# Patient Record
Sex: Female | Born: 1965 | Race: White | Hispanic: No | Marital: Single | Smoking: Former smoker
Health system: Southern US, Community
[De-identification: ages and names within clinical notes are randomized; demographics above are authoritative.]

## PROBLEM LIST (undated history)

## (undated) DIAGNOSIS — I10 Essential (primary) hypertension: Secondary | ICD-10-CM

## (undated) DIAGNOSIS — E7800 Pure hypercholesterolemia, unspecified: Secondary | ICD-10-CM

## (undated) HISTORY — DX: Pure hypercholesterolemia, unspecified: E78.00

---

## 1998-02-28 ENCOUNTER — Ambulatory Visit (HOSPITAL_COMMUNITY): Admission: RE | Admit: 1998-02-28 | Discharge: 1998-02-28 | Payer: Self-pay | Admitting: *Deleted

## 1998-07-16 ENCOUNTER — Inpatient Hospital Stay (HOSPITAL_COMMUNITY): Admission: AD | Admit: 1998-07-16 | Discharge: 1998-07-18 | Payer: Self-pay | Admitting: *Deleted

## 2001-09-22 ENCOUNTER — Ambulatory Visit (HOSPITAL_COMMUNITY): Admission: RE | Admit: 2001-09-22 | Discharge: 2001-09-22 | Payer: Self-pay | Admitting: *Deleted

## 2001-10-25 ENCOUNTER — Inpatient Hospital Stay (HOSPITAL_COMMUNITY): Admission: AD | Admit: 2001-10-25 | Discharge: 2001-10-25 | Payer: Self-pay | Admitting: *Deleted

## 2001-11-03 ENCOUNTER — Encounter: Admission: RE | Admit: 2001-11-03 | Discharge: 2001-11-03 | Payer: Self-pay | Admitting: *Deleted

## 2001-11-17 ENCOUNTER — Encounter: Admission: RE | Admit: 2001-11-17 | Discharge: 2001-11-17 | Payer: Self-pay | Admitting: *Deleted

## 2001-11-24 ENCOUNTER — Ambulatory Visit (HOSPITAL_COMMUNITY): Admission: RE | Admit: 2001-11-24 | Discharge: 2001-11-24 | Payer: Self-pay | Admitting: *Deleted

## 2001-12-01 ENCOUNTER — Encounter: Admission: RE | Admit: 2001-12-01 | Discharge: 2001-12-01 | Payer: Self-pay | Admitting: *Deleted

## 2001-12-06 ENCOUNTER — Encounter: Admission: RE | Admit: 2001-12-06 | Discharge: 2001-12-06 | Payer: Self-pay | Admitting: *Deleted

## 2001-12-08 ENCOUNTER — Encounter: Admission: RE | Admit: 2001-12-08 | Discharge: 2001-12-08 | Payer: Self-pay | Admitting: *Deleted

## 2001-12-09 ENCOUNTER — Encounter: Admission: RE | Admit: 2001-12-09 | Discharge: 2001-12-14 | Payer: Self-pay | Admitting: *Deleted

## 2001-12-14 ENCOUNTER — Encounter: Admission: RE | Admit: 2001-12-14 | Discharge: 2001-12-14 | Payer: Self-pay | Admitting: *Deleted

## 2001-12-21 ENCOUNTER — Encounter: Admission: RE | Admit: 2001-12-21 | Discharge: 2001-12-21 | Payer: Self-pay | Admitting: *Deleted

## 2001-12-26 ENCOUNTER — Ambulatory Visit (HOSPITAL_COMMUNITY): Admission: RE | Admit: 2001-12-26 | Discharge: 2001-12-26 | Payer: Self-pay | Admitting: *Deleted

## 2001-12-29 ENCOUNTER — Encounter: Admission: RE | Admit: 2001-12-29 | Discharge: 2001-12-29 | Payer: Self-pay | Admitting: Obstetrics and Gynecology

## 2002-01-04 ENCOUNTER — Encounter: Admission: RE | Admit: 2002-01-04 | Discharge: 2002-01-04 | Payer: Self-pay | Admitting: *Deleted

## 2002-01-10 ENCOUNTER — Inpatient Hospital Stay (HOSPITAL_COMMUNITY): Admission: AD | Admit: 2002-01-10 | Discharge: 2002-01-12 | Payer: Self-pay | Admitting: *Deleted

## 2002-01-10 ENCOUNTER — Encounter (INDEPENDENT_AMBULATORY_CARE_PROVIDER_SITE_OTHER): Payer: Self-pay | Admitting: Specialist

## 2002-08-22 ENCOUNTER — Encounter: Admission: RE | Admit: 2002-08-22 | Discharge: 2002-08-22 | Payer: Self-pay | Admitting: Obstetrics and Gynecology

## 2009-04-19 ENCOUNTER — Emergency Department (HOSPITAL_COMMUNITY): Admission: EM | Admit: 2009-04-19 | Discharge: 2009-04-19 | Payer: Self-pay | Admitting: Emergency Medicine

## 2014-11-26 ENCOUNTER — Ambulatory Visit (INDEPENDENT_AMBULATORY_CARE_PROVIDER_SITE_OTHER): Payer: Medicaid Other | Admitting: Obstetrics & Gynecology

## 2014-11-26 ENCOUNTER — Encounter: Payer: Self-pay | Admitting: Obstetrics & Gynecology

## 2014-11-26 ENCOUNTER — Other Ambulatory Visit (HOSPITAL_COMMUNITY)
Admission: RE | Admit: 2014-11-26 | Discharge: 2014-11-26 | Disposition: A | Discharge status: Home / Self Care | Payer: Medicaid Other | Source: Ambulatory Visit | Attending: Obstetrics & Gynecology | Admitting: Obstetrics & Gynecology

## 2014-11-26 VITALS — BP 106/69 | HR 55 | Ht 63.0 in | Wt 121.2 lb

## 2014-11-26 DIAGNOSIS — Z1151 Encounter for screening for human papillomavirus (HPV): Secondary | ICD-10-CM | POA: Insufficient documentation

## 2014-11-26 DIAGNOSIS — Z01419 Encounter for gynecological examination (general) (routine) without abnormal findings: Secondary | ICD-10-CM | POA: Insufficient documentation

## 2014-11-26 DIAGNOSIS — Z124 Encounter for screening for malignant neoplasm of cervix: Secondary | ICD-10-CM | POA: Diagnosis not present

## 2014-11-26 DIAGNOSIS — Z3009 Encounter for other general counseling and advice on contraception: Secondary | ICD-10-CM | POA: Diagnosis not present

## 2014-11-26 NOTE — Progress Notes (Signed)
CLINIC ENCOUNTER NOTE  History:  50 y.o. [phone] here today for evaluation of episodic LLQ pain and routine annual gynecologic exam. Episodic for one week, rates 1/10, not associated with intercourse, bowel movement or urination.  Not alleviating or exacerbating factors, not taking any medications.  Unaware of inciting factors.  She denies any abnormal vaginal discharge, bleeding, problems with intercourse or other concerns.  No current pain.  Gynecologic History Patient's last menstrual period was 11/12/2014 (approximate). Contraception: vasectomy Last Pap: 2012. Results were: normal Last mammogram:Never had one, wants to wait until age 70  Obstetric History OB History  Gravida Para Term Preterm AB SAB TAB Ectopic Multiple Living  3 3 2 1      3     # Outcome Date GA Lbr Len/2nd Weight Sex Delivery Anes PTL Lv  3 Preterm           2 Term           1 Term               History reviewed. No pertinent past medical history.  History reviewed. No pertinent past surgical history.  No current outpatient prescriptions on file prior to visit.   No current facility-administered medications on file prior to visit.    No Known Allergies  History   Social History  . Marital Status: Single    Spouse Name: N/A  . Number of Children: N/A  . Years of Education: N/A   Occupational History  . Not on file.   Social History Main Topics  . Smoking status: Never Smoker   . Smokeless tobacco: Never Used  . Alcohol Use: No  . Drug Use: No  . Sexual Activity:    Partners: Male    Birth Control/ Protection: Surgical     Comment: vasectomy   Other Topics Concern  . Not on file   Social History Narrative  . No narrative on file    Family History  Problem Relation Age of Onset  . Diabetes Father   . Heart disease Father   . Diabetes Maternal Grandmother   . Diabetes Maternal Grandfather   . Diabetes Paternal Grandmother   . Diabetes Paternal Grandfather     The following  portions of the patient's history were reviewed and updated as appropriate: allergies, current medications, past family history, past medical history, past social history, past surgical history and problem list.  Review of Systems Pertinent items are noted in HPI.   Objective:   BP 106/69 mmHg  Pulse 55  Ht 5\' 3"  (1.6 m)  Wt 121 lb 3.2 oz (54.976 kg)  BMI 21.48 kg/m2  LMP 11/12/2014 (Approximate) CONSTITUTIONAL: Well-developed, well-nourished female in no acute distress.  HENT:  Normocephalic, atraumatic, External right and left ear normal. Oropharynx is clear and moist EYES: Conjunctivae and EOM are normal. Pupils are equal, round, and reactive to light. No scleral icterus.  NECK: Normal range of motion, supple, no masses.  Normal thyroid.  SKIN: Skin is warm and dry. No rash noted. Not diaphoretic. No erythema. No pallor. Fletcher: Alert and oriented to person, place, and time. Normal reflexes, muscle tone coordination. No cranial nerve deficit noted. PSYCHIATRIC: Normal mood and affect. Normal behavior. Normal judgment and thought content. CARDIOVASCULAR: Normal heart rate noted, regular rhythm RESPIRATORY: Clear to auscultation bilaterally. Effort and breath sounds normal, no problems with respiration noted. BREASTS: Symmetric in size. No masses, skin changes, nipple drainage, or lymphadenopathy. ABDOMEN: Soft, normal bowel sounds, no distention noted.  No tenderness, rebound or guarding.  PELVIC: Normal appearing external genitalia; normal appearing vaginal mucosa and cervix.  No abnormal discharge noted.  Pap smear obtained.  Normal uterine size, no other palpable masses, no uterine or adnexal tenderness. MUSCULOSKELETAL: Normal range of motion. No tenderness.  No cyanosis, clubbing, or edema.  2+ distal pulses.   Assessment:   Annual gynecologic examination with pap smear   Plan:  Will follow up results of pap smear and manage accordingly. Mammogram to be scheduled next  year Continue vasectomy for contraception No pelvic pain on exam, she was told to call/go to ER if pain recurs or gets worse. Can take NSAIDs as needed. Routine preventative health maintenance measures emphasized. Please refer to After Visit Summary for other counseling recommendations.   Verita Schneiders, MD, [facility] [place] [facility] [employer], [place]

## 2014-11-26 NOTE — Progress Notes (Signed)
Left side pelvic pain for about a week and a half.

## 2014-11-28 LAB — CYTOLOGY - PAP

## 2016-02-20 ENCOUNTER — Encounter: Payer: Self-pay | Admitting: Student

## 2016-02-24 ENCOUNTER — Ambulatory Visit: Payer: Medicaid Other | Admitting: Student

## 2016-03-09 ENCOUNTER — Encounter: Payer: Self-pay | Admitting: Student

## 2016-03-09 ENCOUNTER — Ambulatory Visit (INDEPENDENT_AMBULATORY_CARE_PROVIDER_SITE_OTHER): Payer: Medicaid Other | Admitting: Student

## 2016-03-09 VITALS — BP 99/74 | HR 67 | Temp 98.0°F | Ht 64.5 in | Wt 130.4 lb

## 2016-03-09 DIAGNOSIS — Z23 Encounter for immunization: Secondary | ICD-10-CM | POA: Diagnosis not present

## 2016-03-09 DIAGNOSIS — M79672 Pain in left foot: Secondary | ICD-10-CM

## 2016-03-09 DIAGNOSIS — Z0000 Encounter for general adult medical examination without abnormal findings: Secondary | ICD-10-CM | POA: Diagnosis present

## 2016-03-09 LAB — LIPID PANEL
Cholesterol: 190 mg/dL (ref 125–200)
HDL: 60 mg/dL (ref >=46)
LDL CALC: 116 mg/dL (ref <130)
Total CHOL/HDL Ratio: 3.2 Ratio (ref <=5.0)
Triglycerides: 68 mg/dL (ref <150)
VLDL: 14 mg/dL (ref <30)

## 2016-03-09 LAB — BASIC METABOLIC PANEL WITH GFR
BUN: 16 mg/dL (ref 7–25)
CO2: 24 mmol/L (ref 20–31)
Calcium: 9 mg/dL (ref 8.6–10.4)
Chloride: 106 mmol/L (ref 98–110)
Creat: 1.15 mg/dL — ABNORMAL HIGH (ref 0.50–1.05)
GFR, Est African American: 64 mL/min (ref >=60)
GFR, Est Non African American: 56 mL/min — ABNORMAL LOW (ref >=60)
Glucose, Bld: 86 mg/dL (ref 65–99)
Potassium: 4.6 mmol/L (ref 3.5–5.3)
Sodium: 139 mmol/L (ref 135–146)

## 2016-03-09 NOTE — Patient Instructions (Signed)
It was great seeing you today!           Colon Cancer Screening  People with early colon cancer usually have no warning signs or symptoms.  If found early, most patients can be cured, but if found when it has already spread, the chance of survival is not as good.  Colon cancer is the second most common cause of concern is in the [place] with over 56,000 deaths from colon cancer in 2005  Colon cancer is a common, treatable disease. Screening tests can find a cancer  early, before you have symptoms, and make it more likely that you will survive the disease.  Who needs to be tested? If you are age 51-75 yrs, you should be tested for colon cancer.  Ways to be tested:  A colonoscopy the best test to detect colon cancer. It requires you to drink a bowel preparation to clean out your colon before the test. During this test, a tube with a camera inserted into your rectum and examines your entire colon. You can be given medicine to make you sleepy during the exam. Therefore, you will not be able to drive immediately after the test. There is a small risk of bowel injury during the test.   Stool cards that you can take home and take a sample of your stool is another option. The cards are not as good as colonoscopy at detecting cancer, but the tests are easier and cheaper.   To schedule the colonoscopy, you can call one of the 3 options below:  [facility]. Phone number: [phone]  [facility]. Phone number: [phone]  [facility]: Phone number [phone]     If we did any lab work today, and the results require attention, either me or my nurse will get in touch with you. If everything is normal, you will get a letter in mail. If you don't hear from [place] in two weeks, please give [place] a call. Otherwise, I look forward to talking with you again at our next visit. If you have any questions or concerns before then, please call the clinic at [phone].  Please bring all your medications to  every doctors visit   Sign up for My Chart to have easy access to your labs results, and communication with your Primary care physician.    Please check-out at the front desk before leaving the clinic.   Take Care,

## 2016-03-09 NOTE — Progress Notes (Signed)
   Subjective:    Patient ID: Bethany Adkins, female    DOB: [birth year], 50 y.o.   MRN: [id number]   HPI  Patient here to establish care. She has no concern today.   Left Foot pain: for one year. Pain is on and off. 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 Likes to know if she can get a referral to podiatry  Past medical history:  Hyperlipidemia: Reports history of high cholesterol in the past. Not found in the system  Family history:  Heart attack in her father in 61's. Passed away in 70's. Also history of diabetes.   Screening Colon Cancer: cousin with history of colon cancer Breast Cancer: aunt and cousin with history of breast cancer Cervical cancer: Normal Pap smear in 11/2014 Skin Cancer: none  Infection STI: Monogamous relationship  Psych/Social Depression: zero on PHQ2 EtOH abuse: Never Tobacco use: quit 2010. 5-pack-year  Drug use: none Work: As Engineer, maintenance Exercise: running every days, high intensity exercise, yoga Diet: cook at home. Never fast food.  Sexual activity: One female partner Birth control: Partner had vasectomy. LMP 9/24. Started to see changes.   Immunizations  TDAP and flu vaccine today  Review of Systems  Constitutional: Negative.  Negative for appetite change, fever and unexpected weight change.  HENT: Negative for dental problem, hearing loss, sore throat and trouble swallowing.   Eyes: Negative for visual disturbance.  Respiratory: Negative for cough, chest tightness, shortness of breath and wheezing.   Cardiovascular: Negative for chest pain, palpitations and leg swelling.  Gastrointestinal: Negative for abdominal pain, blood in stool and constipation.  Endocrine: Negative for cold intolerance and heat intolerance.  Genitourinary: Negative for dyspareunia, dysuria, genital sores, hematuria and vaginal bleeding.  Musculoskeletal: Negative for arthralgias, joint swelling and myalgias.  Skin: Negative for rash.  Neurological: Negative for weakness, numbness  and headaches.  Hematological: Negative for adenopathy. Does not bruise/bleed easily.  Psychiatric/Behavioral: Negative for dysphoric mood and sleep disturbance. The patient is not nervous/anxious.    Objective:   Physical Exam Vitals:   03/09/16 0950  BP: 99/74  Pulse: 67  Temp: 98 F (36.7 C)  TempSrc: Oral  Weight: 130 lb 6.4 oz (59.1 kg)  Height: 5' 4.5" (1.638 m)    General: Alert and oriented. Pleasant and cooperative. Well-nourished and well-developed.  Head: Normocephalic and atraumatic. Eyes: Without icterus, sclera clear and conjunctiva pink.  Ears: Normal auditory acuity. TMs and ear canals normal Cardiovascular: S1, S2 present without murmurs. Extremities without clubbing or edema. Respiratory: Clear to auscultation bilaterally. No wheezes, rales, or rhonchi. No distress.  Gastrointestinal: +BS, soft, non-tender and non-distended. No HSM noted. No guarding or rebound. No masses appreciated.  Genitourinary: Deferred  Musculoskalatal: Symmetrical without gross deformities. Skin: Intact without significant lesions or rashes. Neurologic: Alert and oriented x4; grossly normal. Psych: Alert and cooperative. Normal mood and affect. Heme/Lymph/Immune: No excessive bruising noted    Assessment & Plan:  Visit for preventive health examination Up-to-date on Pap smear. Gave information to schedule colonoscopy and mammogram Will check lipid panel given history of hyperlipidemia. She is not on any medication We will check BMP Flu and TDAP vaccine today   Left foot pain This is a chronic issue. Intermittent. 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 Otherwise her exam is unremarkable. Placed a referral to podiatry

## 2016-03-09 NOTE — Assessment & Plan Note (Addendum)
Up-to-date on Pap smear. Gave information to schedule colonoscopy and mammogram Will check lipid panel given history of hyperlipidemia. She is not on any medication We will check BMP Flu and TDAP vaccine today

## 2016-03-09 NOTE — Assessment & Plan Note (Signed)
This is a chronic issue. Intermittent. 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 Otherwise her exam is unremarkable. Placed a referral to podiatry

## 2016-03-10 ENCOUNTER — Other Ambulatory Visit: Payer: Self-pay | Admitting: Student

## 2016-03-10 ENCOUNTER — Encounter: Payer: Self-pay | Admitting: Student

## 2016-03-10 DIAGNOSIS — R7989 Other specified abnormal findings of blood chemistry: Secondary | ICD-10-CM

## 2016-03-10 NOTE — Progress Notes (Signed)
Creatinine mildly elevated to 1.15. No baseline to compare to. Patient is very active. She says she have run about 5 miles that morning. Recommended repeat BMP in 2-4 weeks. Patient to call the front desk office and schedule an appointments for lab visit.

## 2016-03-10 NOTE — Progress Notes (Signed)
Ordered repeat BMP for mildly elevated creatinine.

## 2016-03-11 ENCOUNTER — Telehealth: Payer: Self-pay | Admitting: Student

## 2016-03-11 NOTE — Telephone Encounter (Signed)
Pt saw Dr. Cyndia Skeeters recently who suggested a colonoscopy. Pt's insurance requires a referral, [facility] please. Pt also needs a referral for a mammogram at [facility]. Please advise. Thanks! ep

## 2016-03-12 ENCOUNTER — Other Ambulatory Visit: Payer: Self-pay | Admitting: Student

## 2016-03-12 DIAGNOSIS — Z0000 Encounter for general adult medical examination without abnormal findings: Secondary | ICD-10-CM

## 2016-03-12 NOTE — Progress Notes (Signed)
Placed GI referral for colonoscopy to [facility] and screening mammogram at [facility].

## 2016-03-12 NOTE — Telephone Encounter (Signed)
Ordered a screening colonoscopy and mammogram as requested. Thanks!

## 2016-03-13 NOTE — Telephone Encounter (Signed)
LVM to inform pt that referrals have been made. Ottis Stain, CMA

## 2016-03-16 NOTE — Telephone Encounter (Signed)
Pt informed. Zimmerman Rumple, April D, CMA  

## 2016-03-19 ENCOUNTER — Ambulatory Visit (INDEPENDENT_AMBULATORY_CARE_PROVIDER_SITE_OTHER): Payer: Medicaid Other

## 2016-03-19 ENCOUNTER — Encounter: Payer: Self-pay | Admitting: Podiatry

## 2016-03-19 ENCOUNTER — Ambulatory Visit (INDEPENDENT_AMBULATORY_CARE_PROVIDER_SITE_OTHER): Payer: Medicaid Other | Admitting: Podiatry

## 2016-03-19 VITALS — BP 110/70 | HR 60 | Resp 16 | Ht 64.5 in | Wt 130.0 lb

## 2016-03-19 DIAGNOSIS — M79671 Pain in right foot: Secondary | ICD-10-CM

## 2016-03-19 DIAGNOSIS — M79672 Pain in left foot: Principal | ICD-10-CM

## 2016-03-19 DIAGNOSIS — M2010 Hallux valgus (acquired), unspecified foot: Secondary | ICD-10-CM

## 2016-03-19 DIAGNOSIS — M21619 Bunion of unspecified foot: Secondary | ICD-10-CM

## 2016-03-19 DIAGNOSIS — M7750 Other enthesopathy of unspecified foot: Secondary | ICD-10-CM

## 2016-03-19 DIAGNOSIS — M779 Enthesopathy, unspecified: Secondary | ICD-10-CM

## 2016-03-19 NOTE — Progress Notes (Signed)
   Subjective:    Patient ID: Bethany Adkins, female    DOB: June 13, 1966, 50 y.o.   MRN: [id number]  HPI    Review of Systems  All other systems reviewed and are negative.      Objective:   Physical Exam        Assessment & Plan:

## 2016-03-19 NOTE — Patient Instructions (Signed)

## 2016-03-20 NOTE — Progress Notes (Signed)
Subjective:     Patient ID: Bethany Adkins, female   DOB: Feb 04, 1966, 50 y.o.   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      Objective:   Physical Exam  Constitutional: She is oriented to person, place, and time.  Cardiovascular: Intact distal pulses.   Musculoskeletal: Normal range of motion.  Neurological: She is oriented to person, place, and time.  Skin: Skin is warm and dry.  Nursing note and vitals reviewed.  neurovascular status found to be intact with muscle strength adequate range of motion within normal limits with patient found to have posterior pain left heel at the insertional point tendon calcaneus with mild equinus condition and is noted to have structural bunion deformity long-term nature nontender the current time. 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 At this point we will start aggressive physical therapy stretching exercises along with wider shoes anti-inflammatories and patient will be seen back to recheck  X-ray report indicate spur formation with no indication stress fracture or arthritis with moderate structural bunion deformity noted bilateral

## 2016-03-30 ENCOUNTER — Ambulatory Visit
Admission: RE | Admit: 2016-03-30 | Discharge: 2016-03-30 | Disposition: A | Discharge status: Home / Self Care | Payer: Medicaid Other | Source: Ambulatory Visit | Attending: Student | Admitting: Student

## 2016-03-30 DIAGNOSIS — Z0000 Encounter for general adult medical examination without abnormal findings: Secondary | ICD-10-CM

## 2016-03-31 ENCOUNTER — Encounter: Payer: Self-pay | Admitting: Student

## 2016-03-31 NOTE — Progress Notes (Signed)
Sent patient a letter about her mammogram result which is normal.

## 2016-04-16 HISTORY — PX: COLONOSCOPY: SHX174

## 2017-02-01 ENCOUNTER — Ambulatory Visit (INDEPENDENT_AMBULATORY_CARE_PROVIDER_SITE_OTHER): Payer: Medicaid Other | Admitting: Internal Medicine

## 2017-02-01 ENCOUNTER — Encounter: Payer: Self-pay | Admitting: Internal Medicine

## 2017-02-01 VITALS — BP 116/58 | HR 66 | Temp 98.3°F | Ht 64.5 in | Wt 135.0 lb

## 2017-02-01 DIAGNOSIS — M25522 Pain in left elbow: Secondary | ICD-10-CM

## 2017-02-01 NOTE — Progress Notes (Signed)
   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 No hx of trauma. Just noticed about a month ago that her medial elbow hurt sometimes. No bruising. No redness. No hx of this being an issue previously. She came in today to get it checked out because she thought there maybe swelling in the 1 arm greater than the other   Past Medical History Reviewed problem list.  Medications- reviewed and updated No additions to family history Social history- patient is a non-smoker  Objective: BP (!) 116/58 (BP Location: Right Arm, Patient Position: Sitting, Cuff Size: Normal)   Pulse 66   Temp 98.3 F (36.8 C) (Oral)   Ht 5' 4.5" (1.638 m)   Wt 135 lb (61.2 kg)   SpO2 98%   BMI 22.81 kg/m  Gen: NAD, alert, cooperative with exam Arm: Left elbow with slight medial tenderness along the epicondyle, some pain with external rotation and extension of elbow, no significant difference in elbow size from right to left, 5/5 strength in upper extremity, 2+ radial pulses  Skin: dry, intact, no rashes or lesions Neuro: Strength and sensation grossly intact   Assessment/Plan: See problem based a/p  Left elbow pain Likely medial epicondylitis. No concerns about fracture - Counseled patient  - Ibuprofen as needed for the pain  -Counterforce brace as needed  - Follow up with Dr. Cyndia Skeeters as needed

## 2017-02-01 NOTE — Patient Instructions (Addendum)
I want you to use a counterforce brace. I want you to use s as needed and stay away from actions that exacerbate that pain. Please follow up as needed with Dr. Cyndia Skeeters

## 2017-02-01 NOTE — Progress Notes (Deleted)
   [facility] Kerrin Mo, MD Phone: [phone]  Reason For Visit:   # Pain in   Past Medical History Reviewed problem list.  Medications- reviewed and updated No additions to family history Social history- patient is a *** smoker  Objective: There were no vitals taken for this visit. Gen: NAD, alert, cooperative with exam HEENT: Normal    Neck: No masses palpated. No lymphadenopathy    Ears: Tympanic membranes intact, normal light reflex, no erythema, no bulging    Eyes: PERRLA, EOMI    Nose: nasal turbinates moist    Throat: moist mucus membranes, no erythema Cardio: regular rate and rhythm, S1S2 heard, no murmurs appreciated Pulm: clear to auscultation bilaterally, no wheezes, rhonchi or rales GI: soft, non-tender, non-distended, bowel sounds present, no hepatomegaly, no splenomegaly GU: external vaginal tissue ***, cervix ***, *** punctate lesions on cervix appreciated, *** discharge from cervical os, *** bleeding, *** cervical motion tenderness, *** abdominal/ adnexal masses Extremities: warm, well perfused, No edema, cyanosis or clubbing;  MSK: Normal gait and station Skin: dry, intact, no rashes or lesions Neuro: Strength and sensation grossly intact   Assessment/Plan: See problem based a/p  No problem-specific Assessment & Plan notes found for this encounter.

## 2017-02-04 DIAGNOSIS — M25522 Pain in left elbow: Secondary | ICD-10-CM | POA: Insufficient documentation

## 2017-02-04 NOTE — Assessment & Plan Note (Signed)
Likely medial epicondylitis. No concerns about fracture - Counseled patient  - Ibuprofen as needed for the pain  -Counterforce brace as needed  - Follow up with Dr. Cyndia Skeeters as needed

## 2017-04-23 ENCOUNTER — Telehealth: Payer: Self-pay | Admitting: Student

## 2017-04-23 NOTE — Telephone Encounter (Signed)
She had mammogram last year. She should be good until next year this time, once every two years. Thanks, Bretta Bang

## 2017-04-23 NOTE — Telephone Encounter (Signed)
Pt called because she got a thing in the mail saying she is due for another mammogram. She called mammogram place and they said to check with her PCP first to see if her insurance covers it. They will not allow her to schedule an appt until she gets the okay from her PCP. Please advise.

## 2017-04-27 NOTE — Telephone Encounter (Signed)
LVM for pt to call back to inform her of below. Zimmerman Rumple, Masako Overall D, CMA  

## 2017-04-27 NOTE — Telephone Encounter (Signed)
Patient left message on nurse line returning April's call. Called patient and left message on voicemail informing of message from MD.

## 2017-08-29 ENCOUNTER — Other Ambulatory Visit: Payer: Self-pay

## 2017-08-29 ENCOUNTER — Ambulatory Visit (HOSPITAL_COMMUNITY)
Admission: EM | Admit: 2017-08-29 | Discharge: 2017-08-29 | Disposition: A | Discharge status: Home / Self Care | Payer: Medicaid Other | Attending: Emergency Medicine | Admitting: Emergency Medicine

## 2017-08-29 ENCOUNTER — Encounter (HOSPITAL_COMMUNITY): Payer: Self-pay | Admitting: Emergency Medicine

## 2017-08-29 DIAGNOSIS — H9202 Otalgia, left ear: Secondary | ICD-10-CM | POA: Insufficient documentation

## 2017-08-29 DIAGNOSIS — R05 Cough: Secondary | ICD-10-CM | POA: Diagnosis present

## 2017-08-29 DIAGNOSIS — B9789 Other viral agents as the cause of diseases classified elsewhere: Secondary | ICD-10-CM | POA: Diagnosis not present

## 2017-08-29 DIAGNOSIS — H6690 Otitis media, unspecified, unspecified ear: Secondary | ICD-10-CM | POA: Diagnosis not present

## 2017-08-29 DIAGNOSIS — J029 Acute pharyngitis, unspecified: Secondary | ICD-10-CM | POA: Diagnosis present

## 2017-08-29 DIAGNOSIS — J069 Acute upper respiratory infection, unspecified: Secondary | ICD-10-CM

## 2017-08-29 LAB — POCT RAPID STREP A: Streptococcus, Group A Screen (Direct): NEGATIVE

## 2017-08-29 MED ORDER — CETIRIZINE HCL 10 MG PO CAPS: 10.0000 mg | ORAL_CAPSULE | Freq: Every day | ORAL | 0 refills | Status: DC

## 2017-08-29 MED ORDER — BENZONATATE 200 MG PO CAPS: 200.0000 mg | ORAL_CAPSULE | Freq: Three times a day (TID) | ORAL | 0 refills | Status: AC | PRN

## 2017-08-29 MED ORDER — AMOXICILLIN-POT CLAVULANATE 875-125 MG PO TABS: 1.0000 | ORAL_TABLET | Freq: Two times a day (BID) | ORAL | 0 refills | Status: AC

## 2017-08-29 NOTE — ED Provider Notes (Signed)
[place]    CSN: [id number] Arrival date & time: 08/29/17  1200     History   Chief Complaint Chief Complaint  Patient presents with  . Cough    HPI Bethany Adkins is a 52 y.o. female no acute rating past medical history presenting today with URI symptoms.  Symptoms include sore throat, hoarseness, left ear pain, productive cough, rhinorrhea.  Symptoms have been going on for approximately 4 days.  She has been taking Mucinex, and ibuprofen.  Patient denies any fevers, nausea, vomiting, abdominal pain, diarrhea.  She does note that she is finishing up a menstrual cycle, she had not had one for the past 4 months.  This is associated with abdominal cramping for the first few days.  HPI  History reviewed. No pertinent past medical history.  Patient Active Problem List   Diagnosis Date Noted  . Left elbow pain 02/04/2017  . Visit for preventive health examination 03/09/2016  . Left foot pain 03/09/2016    History reviewed. No pertinent surgical history.  OB History    Gravida Para Term Preterm AB Living   3 3 2 1   3    SAB TAB Ectopic Multiple Live Births                   Home Medications    Prior to Admission medications   Medication Sig Start Date End Date Taking? Authorizing Provider  amoxicillin-clavulanate (AUGMENTIN) 875-125 MG tablet Take 1 tablet by mouth every 12 (twelve) hours for 10 days. 08/29/17 09/08/17  Gisella Alwine C, PA-C  benzonatate (TESSALON) 200 MG capsule Take 1 capsule (200 mg total) by mouth 3 (three) times daily as needed for up to 7 days for cough. 08/29/17 09/05/17  Deanda Ruddell C, PA-C  Cetirizine HCl 10 MG CAPS Take 1 capsule (10 mg total) by mouth daily for 14 days. 08/29/17 09/12/17  Langston Summerfield C, PA-C  Omega-3 Fatty Acids (FISH OIL) 1000 MG CAPS Take by mouth.    [provider]    Family History Family History  Problem Relation Age of Onset  . Diabetes Father   . Heart disease Father        heart attack  in 40 years and passed away in 37's  . Diabetes Maternal Grandmother   . Diabetes Maternal Grandfather   . Diabetes Paternal Grandmother   . Diabetes Paternal Grandfather     Social History Social History   Tobacco Use  . Smoking status: Never Smoker  . Smokeless tobacco: Never Used  Substance Use Topics  . Alcohol use: No    Alcohol/week: 0.0 oz  . Drug use: No     Allergies   Patient has no known allergies.   Review of Systems Review of Systems  Constitutional: Negative for chills, fatigue and fever.  HENT: Positive for congestion, ear pain, rhinorrhea, sinus pressure, sore throat and voice change. Negative for trouble swallowing.   Respiratory: Positive for cough. Negative for chest tightness and shortness of breath.   Cardiovascular: Negative for chest pain.  Gastrointestinal: Negative for abdominal pain, nausea and vomiting.  Musculoskeletal: Negative for myalgias.  Skin: Negative for rash.  Neurological: Negative for dizziness, light-headedness and headaches.     Physical Exam Triage Vital Signs ED Triage Vitals [08/29/17 1215]  Enc Vitals Group     BP 113/77     Pulse Rate 60     Resp      Temp 98 F (36.7 C)  Temp Source Oral     SpO2 99 %     Weight      Height      Head Circumference      Peak Flow      Pain Score      Pain Loc      Pain Edu?      Excl. in [place]?    No data found.  Updated Vital Signs BP 113/77 (BP Location: Right Arm)   Pulse 60   Temp 98 F (36.7 C) (Oral)   LMP 08/24/2017   SpO2 99%   Visual Acuity Right Eye Distance:   Left Eye Distance:   Bilateral Distance:    Right Eye Near:   Left Eye Near:    Bilateral Near:     Physical Exam  Constitutional: She is oriented to person, place, and time. She appears well-developed and well-nourished. No distress.  HENT:  Head: Normocephalic and atraumatic.  Right TM nonerythematous, left TM erythematous and injected, no effusion noted.  Nasal mucosa erythematous with  rhinorrhea present bilaterally, posterior oropharynx erythematous, no tonsillar enlargement or exudate.  No uvula deviation.  Eyes: Conjunctivae are normal.  Neck: Neck supple.  Cardiovascular: Normal rate and regular rhythm.  No murmur heard. Pulmonary/Chest: Effort normal and breath sounds normal. No respiratory distress.  Breathing comfortably at rest, CTA BL, no adventitious sounds appreciated  Abdominal: Soft. There is no tenderness.  Musculoskeletal: She exhibits no edema.  Neurological: She is alert and oriented to person, place, and time.  Skin: Skin is warm and dry.  Psychiatric: She has a normal mood and affect.  Nursing note and vitals reviewed.    UC Treatments / Results  Labs (all labs ordered are listed, but only abnormal results are displayed) Labs Reviewed  CULTURE, GROUP A STREP [facility])  POCT RAPID STREP A    EKG  EKG Interpretation None       Radiology No results found.  Procedures Procedures (including critical care time)  Medications Ordered in UC Medications - No data to display   Initial Impression / Assessment and Plan / UC Course  I have reviewed the triage vital signs and the nursing notes.  Pertinent labs & imaging results that were available during my care of the patient were reviewed by me and considered in my medical decision making (see chart for details).     Patient with exam concerning for otitis media, along with viral URI symptoms versus sinusitis.  Given erythematous TM, will treat for otitis media/sinusitis with Augmentin.  Also advised symptomatic control of Zyrtec, continue Mucinex, Tessalon for cough.  Advised hoarseness should resolve as symptoms improve.  May try honey and tea in the meantime as well as Cepacol lozenges, salt water gargles, hydration.  Patient likely premenopausal with recent menstrual cycle, recommended if bleeding continues to follow-up with OB for further evaluation of abnormal bleeding.   Discussed  strict return precautions. Patient verbalized understanding and is agreeable with plan.   Final Clinical Impressions(s) / UC Diagnoses   Final diagnoses:  Acute otitis media, unspecified otitis media type  Viral URI with cough    ED Discharge Orders        Ordered    amoxicillin-clavulanate (AUGMENTIN) 875-125 MG tablet  Every 12 hours     08/29/17 1235    Cetirizine HCl 10 MG CAPS  Daily     08/29/17 1235    benzonatate (TESSALON) 200 MG capsule  3 times daily PRN     08/29/17  1235       Controlled Substance Prescriptions  Controlled Substance Registry consulted? Not Applicable   Janith Lima, [place] 08/29/17 1301

## 2017-08-29 NOTE — ED Triage Notes (Signed)
C/o sore throat, fatigue, hoarseness. Left ear pain with cough

## 2017-08-29 NOTE — Discharge Instructions (Signed)
Please begin Augmentin twice daily for the next 10 days.  This is to treat your ear infection/sinus infection  Please begin Zyrtec for congestion.  Please continue Mucinex at home.  For cough please use Tessalon, or you may try over-the-counter Delsym or Robitussin.  I expect your voice to slowly return to normal as your symptoms improve.  In the meantime please drink honey mixed in tea, salt water gargles, Cepacol lozenges or Chloraseptic spray to help with throat irritation.  Please return if symptoms not improving with treatment, symptoms worsening or changing.

## 2017-08-31 LAB — CULTURE, GROUP A STREP (THRC)

## 2017-09-21 ENCOUNTER — Telehealth (HOSPITAL_COMMUNITY): Payer: Self-pay | Admitting: Emergency Medicine

## 2017-09-21 MED ORDER — FLUCONAZOLE 150 MG PO TABS: 150.0000 mg | ORAL_TABLET | Freq: Once | ORAL | 0 refills | Status: AC

## 2017-09-21 NOTE — Telephone Encounter (Signed)
Finished recent antibiotics, requesting something for yeast infection. 2 tablets provided, return if symptoms not improving.

## 2019-01-10 ENCOUNTER — Other Ambulatory Visit: Payer: Self-pay

## 2019-01-10 DIAGNOSIS — Z20822 Contact with and (suspected) exposure to covid-19: Secondary | ICD-10-CM

## 2019-01-12 LAB — NOVEL CORONAVIRUS, NAA: SARS-CoV-2, NAA: NOT DETECTED

## 2019-01-13 NOTE — Progress Notes (Signed)
Patient called. Reported negative results. Patient verbalized understanding.

## 2019-07-14 ENCOUNTER — Other Ambulatory Visit: Payer: Self-pay | Admitting: *Deleted

## 2019-07-14 DIAGNOSIS — R1900 Intra-abdominal and pelvic swelling, mass and lump, unspecified site: Secondary | ICD-10-CM

## 2019-07-18 ENCOUNTER — Ambulatory Visit
Admission: RE | Admit: 2019-07-18 | Discharge: 2019-07-18 | Disposition: A | Discharge status: Home / Self Care | Payer: BC Managed Care – PPO | Source: Ambulatory Visit | Attending: *Deleted | Admitting: *Deleted

## 2019-07-18 ENCOUNTER — Telehealth: Payer: Self-pay

## 2019-07-18 DIAGNOSIS — R1900 Intra-abdominal and pelvic swelling, mass and lump, unspecified site: Secondary | ICD-10-CM

## 2019-07-18 NOTE — Telephone Encounter (Signed)
Bethany Adkins, with [facility], calls nurse line with report. 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 The imaging was ordered by a walk in clinics physicians assistant. The imaging is available in epic for view. Per Bethany Adkins, she could not find a current pcp for patient, besides [place].   Essentially, the patient has not been seen here in almost 3 years, last visit 01/2017.  Will forward to PCP and a preceptor.

## 2019-07-20 ENCOUNTER — Ambulatory Visit (INDEPENDENT_AMBULATORY_CARE_PROVIDER_SITE_OTHER): Payer: BC Managed Care – PPO | Admitting: Obstetrics & Gynecology

## 2019-07-20 ENCOUNTER — Other Ambulatory Visit: Payer: Self-pay

## 2019-07-20 ENCOUNTER — Encounter: Payer: Self-pay | Admitting: Obstetrics & Gynecology

## 2019-07-20 VITALS — BP 129/84 | HR 85 | Ht 63.0 in | Wt 147.0 lb

## 2019-07-20 DIAGNOSIS — R1900 Intra-abdominal and pelvic swelling, mass and lump, unspecified site: Secondary | ICD-10-CM

## 2019-07-20 NOTE — Patient Instructions (Signed)
Pelvic Mass, Female  A pelvic mass is an abnormal growth in the pelvis. The pelvis is the area between your hip bones. It includes the bladder, rectum, uterus, and ovaries. A pelvic mass may be found during a routine pelvic exam or while performing an MRI, CT scan, or ultrasound for other problems of the abdomen. What are common types of pelvic masses? Pelvic masses include:  Ovarian cysts. These are fluid-filled sacs that form on an ovary.  Tumors. These may be cancerous (malignant) or noncancerous (benign). Noncancerous tumors in the uterus are called uterine fibroids.  Ectopic pregnancy. This is when the fertilized egg attaches (implants) outside the uterus.  Infections. What type of testing may be needed? Your health care provider may recommend that you have tests to diagnose the cause of the pelvic mass. The following tests may be done if a pelvic mass is found:  Physical exam.  Blood tests.  X-rays.  Ultrasound.  CT scan.  MRI.  A surgery to look inside your abdomen with cameras (laparoscopy).  A biopsy that is performed with a needle or during laparoscopy or surgery. In some cases, what seemed like a pelvic mass may actually be something else, such as a mass in one of the organs that is near the pelvis, an infection (abscess), or scar tissue (adhesions) that formed after a surgery. Tests and physical exams may be done once, or they may be done regularly for a period of time. Tests and exams that are done regularly will help monitor whether the mass or tissue change is growing and becoming a concern. What are common treatments? Treatment is not always needed for this condition. Your health care provider may recommend careful monitoring (watchful waiting) and regular tests and exams. Treatment will depend on the cause of the mass. Follow these instructions at home:  What you need to do at home will depend on the cause of the mass. Follow the instructions that your health  care provider gives to you. In general: ? Keep all follow-up visits as directed by your health care provider. This is important. ? Take over-the-counter and prescription medicines only as directed by your health care provider. ? If you were prescribed an antibiotic medicine, take it as told by your health care provider. Do not stop taking the antibiotic even if you start to feel better. ? Follow any restrictions that are given to you by your health care provider.  Try to stay calm, and be sure to ask questions. Make sure you understand the recommendations for monitoring and whether there is a reason for concern. Contact a health care provider if you:  Develop new symptoms.  Note changes in the size, shape, or position of your mass.  Are unable to have a bowel movement.  Bruise or bleed easily. Get help right away if you:  Vomit bright red blood or vomit material that looks like coffee grounds.  Have blood in your stools, or the stools turn black and tarry.  Have an abnormal or increased amount of vaginal bleeding.  Have a fever or chills.  Develop sudden or worsening pain that is not relieved by medicine.  Feel dizzy or weak.  Feel light-headed or you faint.  Feel that the mass has suddenly gotten larger.  Develop severe bloating in your abdomen or your pelvis.  Cannot pass any urine. Summary  A pelvic mass is an abnormal growth in the pelvis. The pelvis is the area between your hip bones. It includes the bladder, rectum,  uterus, and ovaries.  Pelvic masses include ovarian cysts, tumors, ectopic pregnancy, or infections.  Your health care provider may recommend that you have tests to diagnose the cause of the pelvic mass.  Treatment will depend on the cause of the mass. This information is not intended to replace advice given to you by your health care provider. Make sure you discuss any questions you have with your health care provider. Document Revised: 06/23/2017  Document Reviewed: 06/23/2017 Elsevier Patient Education  2020 [employer]  

## 2019-07-20 NOTE — Telephone Encounter (Signed)
Spoke with ordering provider PA [facility] confirmed she will discuss results with patient and follow up appropriately.  Dorris Singh, MD  Family Medicine Teaching Service

## 2019-07-20 NOTE — Progress Notes (Signed)
Patient ID: Bethany Adkins, female   DOB: 03-09-1966, 54 y.o.   MRN: [id number]  Chief Complaint  Patient presents with  . Establish Care    pt was c/o abdominal pain last month around the 22nd. she seen her pcp.  pcp thought they had felt something usual. pt got ultrasound done tuesday. 2 mass seen on ovary.     HPI Bethany Adkins is a 54 y.o. female.  [id number] Patient's last menstrual period was 08/24/2017. Since 06/21/19 she has had abdominal bloating, occasional lower abdominal pain, nausea. Mass was palpated by [facility] physicians PA 1/22 and pelvic US showed pelvic masses. HPI  No past medical history on file.  No past surgical history on file.  Family History  Problem Relation Age of Onset  . Diabetes Father   . Heart disease Father        heart attack in 40 years and passed away in 44's  . Diabetes Maternal Grandmother   . Diabetes Maternal Grandfather   . Diabetes Paternal Grandmother   . Diabetes Paternal Grandfather     Social History Social History   Tobacco Use  . Smoking status: Never Smoker  . Smokeless tobacco: Never Used  Substance Use Topics  . Alcohol use: No    Alcohol/week: 0.0 standard drinks  . Drug use: No    No Known Allergies  Current Outpatient Medications  Medication Sig Dispense Refill  . Omega-3 Fatty Acids (FISH OIL) 1000 MG CAPS Take by mouth.     No current facility-administered medications for this visit.    Review of Systems Review of Systems  Constitutional: Positive for unexpected weight change (gain).  Respiratory: Negative.   Cardiovascular: Negative.   Gastrointestinal: Positive for abdominal distention, abdominal pain and nausea. Negative for constipation.  Genitourinary: Positive for pelvic pain.    Blood pressure 129/84, pulse 85, height 5\' 3"  (1.6 m), weight 147 lb (66.7 kg), last menstrual period 08/24/2017.  Physical Exam Physical Exam Vitals and nursing note reviewed.  Constitutional:      Appearance: Normal  appearance.  Cardiovascular:     Rate and Rhythm: Normal rate.  Pulmonary:     Effort: Pulmonary effort is normal.  Abdominal:     General: There is distension.     Palpations: There is mass (lower quadrants, not tender).  Neurological:     Mental Status: She is alert.     Data Reviewed CLINICAL DATA:  Pelvic mass  EXAM: TRANSABDOMINAL AND TRANSVAGINAL ULTRASOUND OF PELVIS  TECHNIQUE: Both transabdominal and transvaginal ultrasound examinations of the pelvis were performed. Transabdominal technique was performed for global imaging of the pelvis including uterus, ovaries, adnexal regions, and pelvic cul-de-sac. It was necessary to proceed with endovaginal exam following the transabdominal exam to visualize the endometrium, and to characterize ovarian abnormalities.  COMPARISON:  None  FINDINGS: Uterus  Measurements: 8.8 x 2.5 x 3.3 cm = volume: 38 mL. Retroverted. Mildly heterogeneous myometrial echogenicity without focal mass  Endometrium  Thickness: 2 mm.  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  Complex multiloculated solid and cystic mass of RIGHT ovary 13.8 x 6.6 x 14.0 cm, containing solid components, complex cystic components with internal echogenicity, and a few simple cystic components.  Complex multiloculated solid and cystic LEFT ovarian mass 8.1  x 5.4 x 5.8 cm, containing solid components, complex cystic components with internal echogenicity, irregular septations, and mild mural nodularity.  Both large adnexal masses demonstrate blood flow within the complicated components on color Doppler imaging.  IMPRESSION: Unremarkable uterus and endometrial complex.  Large complex solid  and cystic masses in the adnexa bilaterally consistent with BILATERAL ovarian neoplasms, measuring 13.8 x 6.6 x 14.0 cm RIGHT and 8.1 x 5.4 x 5.8 cm LEFT.  Surgical evaluation recommended.  These results will be called to the ordering clinician or representative by the Radiologist Assistant, and communication documented in the PACS or zVision Dashboard.   Electronically Signed   By: Lavonia Dana M.D.   On: 07/18/2019 15:03    Normal pap smear and negative high-risk HPV on 11/26/14.  Assessment Bilateral complex adnexal masses suspicious for ovarian neoplasm.   Plan Referral to Gyn onc CA 125 today I answered her questions about the suspicion for ovarian cancer and likely hysterectomy and BSO by gyn oncologist, possible chemotherapy.    Bethany Adkins 07/20/2019, 2:01 PM

## 2019-07-22 LAB — CA 125: Cancer Antigen (CA) 125: 25.3 U/mL (ref 0.0–38.1)

## 2019-07-24 ENCOUNTER — Telehealth: Payer: Self-pay | Admitting: *Deleted

## 2019-07-24 NOTE — Progress Notes (Signed)
Normal CA125, keep gyn onc appt

## 2019-07-24 NOTE — Telephone Encounter (Signed)
Called Bethany Adkins and informed her of CA-125 results per request of Dr. Roselie Awkward. Bethany Adkins was advised of the importance of keeping scheduled appointment w/Dr. Denman George on 2/10 @ 1130. She will receive pelvic exam and discussion of treatment options at that time. Bethany Adkins's questions were answered and she voiced understanding of all information and instructions given.

## 2019-07-26 ENCOUNTER — Encounter: Payer: Self-pay | Admitting: Gynecologic Oncology

## 2019-07-26 ENCOUNTER — Other Ambulatory Visit: Payer: Self-pay

## 2019-07-26 ENCOUNTER — Inpatient Hospital Stay: Payer: BC Managed Care – PPO | Attending: Gynecologic Oncology | Admitting: Gynecologic Oncology

## 2019-07-26 VITALS — BP 136/91 | HR 70 | Temp 97.9°F | Resp 17 | Ht 63.0 in | Wt 145.8 lb

## 2019-07-26 DIAGNOSIS — Z87891 Personal history of nicotine dependence: Secondary | ICD-10-CM

## 2019-07-26 DIAGNOSIS — N839 Noninflammatory disorder of ovary, fallopian tube and broad ligament, unspecified: Secondary | ICD-10-CM | POA: Diagnosis not present

## 2019-07-26 DIAGNOSIS — R109 Unspecified abdominal pain: Secondary | ICD-10-CM

## 2019-07-26 DIAGNOSIS — Z8249 Family history of ischemic heart disease and other diseases of the circulatory system: Secondary | ICD-10-CM | POA: Diagnosis not present

## 2019-07-26 DIAGNOSIS — N838 Other noninflammatory disorders of ovary, fallopian tube and broad ligament: Secondary | ICD-10-CM

## 2019-07-26 DIAGNOSIS — Z833 Family history of diabetes mellitus: Secondary | ICD-10-CM | POA: Insufficient documentation

## 2019-07-26 DIAGNOSIS — E7800 Pure hypercholesterolemia, unspecified: Secondary | ICD-10-CM

## 2019-07-26 DIAGNOSIS — D4959 Neoplasm of unspecified behavior of other genitourinary organ: Secondary | ICD-10-CM | POA: Insufficient documentation

## 2019-07-26 DIAGNOSIS — N9489 Other specified conditions associated with female genital organs and menstrual cycle: Secondary | ICD-10-CM

## 2019-07-26 NOTE — Patient Instructions (Signed)
March 11th is the presumed OR date for your surgery which will be a robotic assisted total hysterectomy with removal of both tubes and ovaries.  Dr Denman George has ordered a CT scan to evaluate the upper abdomen.  Her office will bring you back before surgery to discuss the postoperative care and expectations with nurse practitioner Joylene John.  Her office can be reached at [phone].

## 2019-07-26 NOTE — H&P (View-Only) (Signed)
Consult Note: Gyn-Onc  Consult was requested by Dr. Roselie Awkward for the evaluation of Bethany Adkins 54 y.o. female  CC:  Chief Complaint  Patient presents with  . Bilateral Adnexal Masses    Assessment/Plan:  Ms. Bethany Adkins  is a 54 y.o.  year old with bilateral complex ovarian masses and normal CA 125. She has abdominal pain from the ovarian masses.   Overall from ultrasound findings and normal Ca1 25 I favor benign diagnosis, however given the complexity and her postmenopausal age I recommend surgical removal.  We will first obtain a CT scan of the abdomen and pelvis to rule out gross upper abdominal metastatic disease.  Assuming this is negative she is scheduled for a robotic assisted total hysterectomy with BSO.  She may require mini laparotomy for specimen delivery.  She may require conversion to laparotomy or staging procedures pending pathology findings.  I explained surgical risks to the patient including  bleeding, infection, damage to internal organs (such as bladder,ureters, bowels), blood clot, reoperation and rehospitalization.  Surgery is scheduled for 08/24/19.  HPI: Ms Bethany Adkins is a 54 year old P3 who was seen in consultation at the request of Dr Roselie Awkward for bilateral complex ovarian masses.   The patient reported first noting symptoms of abdominal pains on June 21, 2019.  She attributed this to stress [place].  However the pain and abdominal bloating continued and she eventually determined that this was less likely to be a psychologic phenomenon and instead saw her gynecologist Dr. Roselie Awkward on July 20, 2019.  At that time a transvaginal ultrasound scan was performed which revealed bilateral ovarian masses measuring on the right 13.8 x 6.6 x 14 cm containing solid components, complex cystic components with internal echogenicity and a few simple cystic components, and a complex multiloculated solid and cystic  left ovarian mass measuring 8.1 x 5.4 x 5.8 cm containing solid components complex cystic components with internal echogenicity irregular septations and mild mural nodularity.  There was Doppler blood flow within the complicated components on color Doppler imaging.  The uterus itself was unremarkable and measured 8.8 x 2.5 x 3.3 cm with a 2 mm endometrium.  Ca1 25 was drawn on 07/20/2019 and was normal at 25.3.  The patient is postmenopausal as of February 2020.  The patient is otherwise fairly healthy with no significant past medical history.  She is not treated routinely with medications.  She is never had prior surgery.  Gynecologic history significant for 3 prior vaginal births.  She has never had an abnormal Pap test.  Her family history is unremarkable for first and second degree relatives with malignancy however she does have some cousins who have cancer of unknown primary.  Her sister may have had a precancer of the uterus which was removed surgically.  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  Current Meds:  Outpatient Encounter Medications as of 07/26/2019  Medication Sig  . Omega-3 Fatty Acids (FISH OIL) 1000 MG CAPS Take by mouth.   No facility-administered encounter medications on file as of 07/26/2019.    Allergy: No Known Allergies  Social Hx:   Social History   Socioeconomic History  . Marital status: Single    Spouse name: Not on file  . Number of children: Not on file  .  Years of education: Not on file  . Highest education level: Not on file  Occupational History  . Not on file  Tobacco Use  . Smoking status: Former Smoker    Years: 10.00    Types: Cigarettes    Quit date: 07/24/2008    Years since quitting: 11.0  . Smokeless tobacco: Never Used  Substance and Sexual Activity  . Alcohol use: No    Alcohol/week: 0.0 standard drinks  . Drug use: No   . Sexual activity: Yes    Partners: Male    Birth control/protection: Surgical    Comment: vasectomy  Other Topics Concern  . Not on file  Social History Narrative  . Not on file   Social Determinants of Health   Financial Resource Strain:   . Difficulty of Paying Living Expenses: Not on file  Food Insecurity:   . Worried About Charity fundraiser in the Last Year: Not on file  . Ran Out of Food in the Last Year: Not on file  Transportation Needs:   . Lack of Transportation (Medical): Not on file  . Lack of Transportation (Non-Medical): Not on file  Physical Activity:   . Days of Exercise per Week: Not on file  . Minutes of Exercise per Session: Not on file  Stress:   . Feeling of Stress : Not on file  Social Connections:   . Frequency of Communication with Friends and Family: Not on file  . Frequency of Social Gatherings with Friends and Family: Not on file  . Attends Religious Services: Not on file  . Active Member of Clubs or Organizations: Not on file  . Attends Archivist Meetings: Not on file  . Marital Status: Not on file  Intimate Partner Violence:   . Fear of Current or Ex-Partner: Not on file  . Emotionally Abused: Not on file  . Physically Abused: Not on file  . Sexually Abused: Not on file    Past Surgical Hx:  Past Surgical History:  Procedure Laterality Date  . COLONOSCOPY  04/16/2016    Past Medical Hx:  Past Medical History:  Diagnosis Date  . Hypercholesteremia     Past Gynecological History:  See HPI Patient's last menstrual period was 08/24/2017.  Family Hx:  Family History  Problem Relation Age of Onset  . Diabetes Father   . Heart disease Father        heart attack in 40 years and passed away in 15's  . Diabetes Maternal Grandmother   . Diabetes Maternal Grandfather   . Diabetes Paternal Grandmother   . Diabetes Paternal Grandfather   . Ovarian cancer Neg Hx   . Endometrial cancer Neg Hx     Review of  Systems:  Constitutional  Feels well,    ENT Normal appearing ears and nares bilaterally Skin/Breast  No rash, sores, jaundice, itching, dryness Cardiovascular  No chest pain, shortness of breath, or edema  Pulmonary  No cough or wheeze.  Gastro Intestinal  + abdominal pain and bloating Genito Urinary  No frequency, urgency, dysuria, no bleeding Musculo Skeletal  No myalgia, arthralgia, joint swelling or pain  Neurologic  No weakness, numbness, change in gait,  Psychology  No depression, anxiety, insomnia.   Vitals:  Blood pressure (!) 136/91, pulse 70, temperature 97.9 F (36.6 C), temperature source Temporal, resp. rate 17, height 5\' 3"  (1.6 m), weight 145 lb 12.8 oz (66.1 kg), last menstrual period 08/24/2017, SpO2 100 %. BMI 26.  Physical Exam: WD  in NAD Neck  Supple NROM, without any enlargements.  Lymph Node Survey No cervical supraclavicular or inguinal adenopathy Cardiovascular  Pulse normal rate, regularity and rhythm. S1 and S2 normal.  Lungs  Clear to auscultation bilateraly, without wheezes/crackles/rhonchi. Good air movement.  Skin  No rash/lesions/breakdown  Psychiatry  Alert and oriented to person, place, and time  Abdomen  Normoactive bowel sounds, abdomen soft, non-tender and thin without evidence of hernia. Fullness in lower abdomen. Back No CVA tenderness Genito Urinary  Vulva/vagina: Normal external female genitalia.  No lesions. No discharge or bleeding.  Bladder/urethra:  No lesions or masses, well supported bladder  Vagina: normal  Cervix: Normal appearing, no lesions. Displaced anteriorally  Uterus:  Small, mobile, no parametrial involvement or nodularity.  Adnexa: large cystic somewhat nodular masses filling pelvis - mobile. Rectal  Good tone, no masses no cul de sac nodularity but there is nodularity to the posterior surface of an ovarian cyst that can be appreciated.  Extremities  No bilateral cyanosis, clubbing or edema.   Thereasa Solo, MD  07/26/2019, 12:17 PM

## 2019-07-26 NOTE — Progress Notes (Signed)
Consult Note: Gyn-Onc  Consult was requested by Dr. Roselie Awkward for the evaluation of Bethany Adkins 54 y.o. female  CC:  Chief Complaint  Patient presents with  . Bilateral Adnexal Masses    Assessment/Plan:  Ms. Bethany Adkins  is a 54 y.o.  year old with bilateral complex ovarian masses and normal CA 125. She has abdominal pain from the ovarian masses.   Overall from ultrasound findings and normal Ca1 25 I favor benign diagnosis, however given the complexity and her postmenopausal age I recommend surgical removal.  We will first obtain a CT scan of the abdomen and pelvis to rule out gross upper abdominal metastatic disease.  Assuming this is negative she is scheduled for a robotic assisted total hysterectomy with BSO.  She may require mini laparotomy for specimen delivery.  She may require conversion to laparotomy or staging procedures pending pathology findings.  I explained surgical risks to the patient including  bleeding, infection, damage to internal organs (such as bladder,ureters, bowels), blood clot, reoperation and rehospitalization.  Surgery is scheduled for 08/24/19.  HPI: Ms Bethany Adkins is a 54 year old P3 who was seen in consultation at the request of Dr Roselie Awkward for bilateral complex ovarian masses.   The patient reported first noting symptoms of abdominal pains on June 21, 2019.  She attributed this to stress from [place].  However the pain and abdominal bloating continued and she eventually determined that this was less likely to be a psychologic phenomenon and instead saw her gynecologist Dr. Roselie Awkward on July 20, 2019.  At that time a transvaginal ultrasound scan was performed which revealed bilateral ovarian masses measuring on the right 13.8 x 6.6 x 14 cm containing solid components, complex cystic components with internal echogenicity and a few simple cystic components, and a complex multiloculated solid and cystic  left ovarian mass measuring 8.1 x 5.4 x 5.8 cm containing solid components complex cystic components with internal echogenicity irregular septations and mild mural nodularity.  There was Doppler blood flow within the complicated components on color Doppler imaging.  The uterus itself was unremarkable and measured 8.8 x 2.5 x 3.3 cm with a 2 mm endometrium.  Ca1 25 was drawn on 07/20/2019 and was normal at 25.3.  The patient is postmenopausal as of February 2020.  The patient is otherwise fairly healthy with no significant past medical history.  She is not treated routinely with medications.  She is never had prior surgery.  Gynecologic history significant for 3 prior vaginal births.  She has never had an abnormal Pap test.  Her family history is unremarkable for first and second degree relatives with malignancy however she does have some cousins who have cancer of unknown primary.  Her sister may have had a precancer of the uterus which was removed surgically.  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  Current Meds:  Outpatient Encounter Medications as of 07/26/2019  Medication Sig  . Omega-3 Fatty Acids (FISH OIL) 1000 MG CAPS Take by mouth.   No facility-administered encounter medications on file as of 07/26/2019.    Allergy: No Known Allergies  Social Hx:   Social History   Socioeconomic History  . Marital status: Single    Spouse name: Not on file  . Number of children: Not on file  .  Years of education: Not on file  . Highest education level: Not on file  Occupational History  . Not on file  Tobacco Use  . Smoking status: Former Smoker    Years: 10.00    Types: Cigarettes    Quit date: 07/24/2008    Years since quitting: 11.0  . Smokeless tobacco: Never Used  Substance and Sexual Activity  . Alcohol use: No    Alcohol/week: 0.0 standard drinks  . Drug use: No   . Sexual activity: Yes    Partners: Male    Birth control/protection: Surgical    Comment: vasectomy  Other Topics Concern  . Not on file  Social History Narrative  . Not on file   Social Determinants of Health   Financial Resource Strain:   . Difficulty of Paying Living Expenses: Not on file  Food Insecurity:   . Worried About Charity fundraiser in the Last Year: Not on file  . Ran Out of Food in the Last Year: Not on file  Transportation Needs:   . Lack of Transportation (Medical): Not on file  . Lack of Transportation (Non-Medical): Not on file  Physical Activity:   . Days of Exercise per Week: Not on file  . Minutes of Exercise per Session: Not on file  Stress:   . Feeling of Stress : Not on file  Social Connections:   . Frequency of Communication with Friends and Family: Not on file  . Frequency of Social Gatherings with Friends and Family: Not on file  . Attends Religious Services: Not on file  . Active Member of Clubs or Organizations: Not on file  . Attends Archivist Meetings: Not on file  . Marital Status: Not on file  Intimate Partner Violence:   . Fear of Current or Ex-Partner: Not on file  . Emotionally Abused: Not on file  . Physically Abused: Not on file  . Sexually Abused: Not on file    Past Surgical Hx:  Past Surgical History:  Procedure Laterality Date  . COLONOSCOPY  04/16/2016    Past Medical Hx:  Past Medical History:  Diagnosis Date  . Hypercholesteremia     Past Gynecological History:  See HPI Patient's last menstrual period was 08/24/2017.  Family Hx:  Family History  Problem Relation Age of Onset  . Diabetes Father   . Heart disease Father        heart attack in 40 years and passed away in 75's  . Diabetes Maternal Grandmother   . Diabetes Maternal Grandfather   . Diabetes Paternal Grandmother   . Diabetes Paternal Grandfather   . Ovarian cancer Neg Hx   . Endometrial cancer Neg Hx     Review of  Systems:  Constitutional  Feels well,    ENT Normal appearing ears and nares bilaterally Skin/Breast  No rash, sores, jaundice, itching, dryness Cardiovascular  No chest pain, shortness of breath, or edema  Pulmonary  No cough or wheeze.  Gastro Intestinal  + abdominal pain and bloating Genito Urinary  No frequency, urgency, dysuria, no bleeding Musculo Skeletal  No myalgia, arthralgia, joint swelling or pain  Neurologic  No weakness, numbness, change in gait,  Psychology  No depression, anxiety, insomnia.   Vitals:  Blood pressure (!) 136/91, pulse 70, temperature 97.9 F (36.6 C), temperature source Temporal, resp. rate 17, height 5\' 3"  (1.6 m), weight 145 lb 12.8 oz (66.1 kg), last menstrual period 08/24/2017, SpO2 100 %. BMI 26.  Physical Exam: WD  in NAD Neck  Supple NROM, without any enlargements.  Lymph Node Survey No cervical supraclavicular or inguinal adenopathy Cardiovascular  Pulse normal rate, regularity and rhythm. S1 and S2 normal.  Lungs  Clear to auscultation bilateraly, without wheezes/crackles/rhonchi. Good air movement.  Skin  No rash/lesions/breakdown  Psychiatry  Alert and oriented to person, place, and time  Abdomen  Normoactive bowel sounds, abdomen soft, non-tender and thin without evidence of hernia. Fullness in lower abdomen. Back No CVA tenderness Genito Urinary  Vulva/vagina: Normal external female genitalia.  No lesions. No discharge or bleeding.  Bladder/urethra:  No lesions or masses, well supported bladder  Vagina: normal  Cervix: Normal appearing, no lesions. Displaced anteriorally  Uterus:  Small, mobile, no parametrial involvement or nodularity.  Adnexa: large cystic somewhat nodular masses filling pelvis - mobile. Rectal  Good tone, no masses no cul de sac nodularity but there is nodularity to the posterior surface of an ovarian cyst that can be appreciated.  Extremities  No bilateral cyanosis, clubbing or edema.   Thereasa Solo, MD  07/26/2019, 12:17 PM

## 2019-07-27 ENCOUNTER — Telehealth: Payer: Self-pay | Admitting: *Deleted

## 2019-07-27 ENCOUNTER — Telehealth: Payer: Self-pay | Admitting: Gynecologic Oncology

## 2019-07-27 ENCOUNTER — Other Ambulatory Visit: Payer: Self-pay | Admitting: Gynecologic Oncology

## 2019-07-27 DIAGNOSIS — D4959 Neoplasm of unspecified behavior of other genitourinary organ: Secondary | ICD-10-CM

## 2019-07-27 DIAGNOSIS — N9489 Other specified conditions associated with female genital organs and menstrual cycle: Secondary | ICD-10-CM

## 2019-07-27 NOTE — Telephone Encounter (Signed)
Pt returning a call from Heywood Iles NP. Pt is agreeable with plan to reschedule her surgery date for 2/18 and preop appt. to 2/16 at 9am w/ M. Cross NP  It was noted that the patient is taking fish oil. Pt notified that she should stop taking her fish oil supplement starting today. Pt verbalizes understanding.

## 2019-07-27 NOTE — Telephone Encounter (Signed)
Called patient to see if she would like to schedule her surgery for Feb 18. Left message asking her to please call the office to discuss.

## 2019-07-31 ENCOUNTER — Other Ambulatory Visit (HOSPITAL_COMMUNITY)
Admission: RE | Admit: 2019-07-31 | Discharge: 2019-07-31 | Disposition: A | Discharge status: Home / Self Care | Payer: BC Managed Care – PPO | Source: Ambulatory Visit | Attending: Gynecologic Oncology | Admitting: Gynecologic Oncology

## 2019-07-31 DIAGNOSIS — Z20822 Contact with and (suspected) exposure to covid-19: Secondary | ICD-10-CM | POA: Insufficient documentation

## 2019-07-31 DIAGNOSIS — Z01812 Encounter for preprocedural laboratory examination: Secondary | ICD-10-CM | POA: Insufficient documentation

## 2019-07-31 LAB — SARS CORONAVIRUS 2 (TAT 6-24 HRS): SARS Coronavirus 2: NEGATIVE

## 2019-08-01 ENCOUNTER — Inpatient Hospital Stay (HOSPITAL_BASED_OUTPATIENT_CLINIC_OR_DEPARTMENT_OTHER): Payer: BC Managed Care – PPO | Admitting: Gynecologic Oncology

## 2019-08-01 ENCOUNTER — Other Ambulatory Visit: Payer: Self-pay

## 2019-08-01 ENCOUNTER — Encounter: Payer: Self-pay | Admitting: Gynecologic Oncology

## 2019-08-01 ENCOUNTER — Encounter (HOSPITAL_COMMUNITY): Payer: Self-pay

## 2019-08-01 ENCOUNTER — Ambulatory Visit (HOSPITAL_COMMUNITY)
Admission: RE | Admit: 2019-08-01 | Discharge: 2019-08-01 | Disposition: A | Discharge status: Home / Self Care | Payer: BC Managed Care – PPO | Source: Ambulatory Visit | Attending: Gynecologic Oncology | Admitting: Gynecologic Oncology

## 2019-08-01 VITALS — BP 148/88 | HR 62 | Temp 98.5°F | Resp 16 | Ht 63.0 in | Wt 147.0 lb

## 2019-08-01 DIAGNOSIS — D4959 Neoplasm of unspecified behavior of other genitourinary organ: Secondary | ICD-10-CM

## 2019-08-01 DIAGNOSIS — N9489 Other specified conditions associated with female genital organs and menstrual cycle: Secondary | ICD-10-CM

## 2019-08-01 MED ORDER — IBUPROFEN 800 MG PO TABS: 800.0000 mg | ORAL_TABLET | Freq: Three times a day (TID) | ORAL | 0 refills | Status: DC | PRN

## 2019-08-01 MED ORDER — TRAMADOL HCL 50 MG PO TABS: 50.0000 mg | ORAL_TABLET | Freq: Four times a day (QID) | ORAL | 0 refills | Status: DC | PRN

## 2019-08-01 MED ORDER — SENNOSIDES-DOCUSATE SODIUM 8.6-50 MG PO TABS: 2.0000 | ORAL_TABLET | Freq: Every day | ORAL | 1 refills | Status: DC

## 2019-08-01 MED ORDER — IOHEXOL 300 MG/ML  SOLN
100.0000 mL | Freq: Once | INTRAMUSCULAR | Status: AC | PRN
  Administered 2019-08-01: 08:00:00 100 mL via INTRAVENOUS

## 2019-08-01 MED ORDER — SODIUM CHLORIDE (PF) 0.9 % IJ SOLN
INTRAMUSCULAR | Status: AC
  Filled 2019-08-01: qty 50

## 2019-08-01 NOTE — Progress Notes (Signed)
Patient here for a pre-operative appointment prior to her scheduled surgery on August 03, 2019. She is scheduled for a robotic assisted total laparoscopic hysterectomy, bilateral salpingo-oophorectomy, possible staging, possible mini laparotomy.  She has her pre-operative appointments arranged and had a CT scan this am. The surgery was discussed in detail.  See after visit summary for additional details. Visual aids used to discuss items related to surgery including the incentive spirometer, sequential compression stockings, foley catheter, IV pump, multi-modal pain regimen including tylenol, photo of the surgical robot, female reproductive system to discuss surgery in detail.  CT discussed in detail. All questions answered.      Discussed post-op pain management in detail including the aspects of the enhanced recovery pathway.  Advised her that a new prescription would be sent in for tramadol and it is only to be used for after her upcoming surgery.  We discussed the use of tylenol post-op and to monitor for a maximum of 4,000 mg in a 24 hour period.  Also prescribed sennakot to be used after surgery and to hold if having loose stools.  Discussed bowel regimen in detail.     Discussed the use of lovenox pre-op, SCDs, and measures to take at home to prevent DVT including frequent mobility.  Reportable signs and symptoms of DVT discussed. Post-operative instructions discussed and expectations for after surgery. Incisional care discussed as well including reportable signs and symptoms including erythema, drainage, wound separation.     10 minutes spent with the patient.  Verbalizing understanding of material discussed. No needs or concerns voiced at the end of the visit.   Advised patient and family to call for any needs.  Advised that her post-operative medications had been prescribed and could be picked up at any time.

## 2019-08-01 NOTE — Patient Instructions (Addendum)
DUE TO COVID-19 ONLY ONE VISITOR IS ALLOWED TO COME WITH YOU AND STAY IN THE WAITING ROOM ONLY DURING PRE OP AND PROCEDURE DAY OF SURGERY. THE 1 VISITOR MAY VISIT WITH YOU AFTER SURGERY IN YOUR PRIVATE ROOM DURING VISITING HOURS ONLY!   ONCE YOUR COVID TEST IS COMPLETED, PLEASE BEGIN THE QUARANTINE INSTRUCTIONS AS OUTLINED IN YOUR HANDOUT.                Bethany Adkins     Your procedure is scheduled on: Thursday 08/03/2019   Report to [facility] Main  Entrance    Report to admitting at  0930  AM     Call this number if you have problems the morning of surgery [phone]     Eat a light diet the day before surgery.  Examples including soups, broths, toast, yogurt, mashed potatoes.  Things to avoid include carbonated beverages (fizzy beverages), raw fruits and raw vegetables, or beans.    If your bowels are filled with gas, your surgeon will have difficulty visualizing your pelvic organs which increases your surgical risks.               Remember: DO NOT EAT SOLID FOODS :after midnight!  You can have CLEAR LIQUIDS FROM MIDNIGHT UP UNTIL 0830 am then nothing until after surgery!   CLEAR LIQUID DIET   Foods Allowed                                                                     Foods Excluded  Coffee and tea, regular and decaf                             liquids that you cannot  Plain Jell-O any favor except red or purple                                           see through such as: Fruit ices (not with fruit pulp)                                     milk, soups, orange juice  Iced Popsicles                                    All solid food                                   Cranberry, grape and apple juices Sports drinks like Gatorade Lightly seasoned clear broth or consume(fat free) Sugar, honey syrup  Sample Menu Breakfast                                Lunch  Supper Cranberry juice                    Beef broth                             Chicken broth Jell-O                                     Grape juice                           Apple juice Coffee or tea                        Jell-O                                      Popsicle                                                Coffee or tea                        Coffee or tea  _____________________________________________________________________          BRUSH YOUR TEETH MORNING OF SURGERY AND RINSE YOUR MOUTH OUT, NO CHEWING GUM CANDY OR MINTS.     Take these medicines the morning of surgery with A SIP OF WATER: none                                 You may not have any metal on your body including hair pins and              piercings  Do not wear jewelry, make-up, lotions, powders or perfumes, deodorant             Do not wear nail polish on your fingernails.  Do not shave  48 hours prior to surgery.                 Do not bring valuables to the hospital. [place].  Contacts, dentures or bridgework may not be worn into surgery.  Leave suitcase in the car. After surgery it may be brought to your room.     Patients discharged the day of surgery will not be allowed to drive home. IF YOU ARE HAVING SURGERY AND GOING HOME THE SAME DAY, YOU MUST HAVE AN ADULT TO DRIVE YOU HOME AND  BE WITH YOU FOR 24 HOURS. YOU MAY GO HOME BY TAXI OR UBER OR ORTHERWISE, BUT AN ADULT MUST ACCOMPANY YOU HOME AND STAY WITH YOU FOR 24 HOURS.  Name and phone number of your driver:daughter- Bethany Adkins  [phone]               Please read over the following fact sheets you were given: _____________________________________________________________________             [facility] - Preparing for Surgery  Before surgery, you can play an important role.  Because skin is not sterile, your skin needs to be as free of germs as possible.  You can reduce the number of germs on your skin by washing with CHG (chlorahexidine  gluconate) soap before surgery.  CHG is an antiseptic cleaner which kills germs and bonds with the skin to continue killing germs even after washing. Please DO NOT use if you have an allergy to CHG or antibacterial soaps.  If your skin becomes reddened/irritated stop using the CHG and inform your nurse when you arrive at Short Stay. Do not shave (including legs and underarms) for at least 48 hours prior to the first CHG shower.  You may shave your face/neck. Please follow these instructions carefully:  1.  Shower with CHG Soap the night before surgery and the  morning of Surgery.  2.  If you choose to wash your hair, wash your hair first as usual with your  normal  shampoo.  3.  After you shampoo, rinse your hair and body thoroughly to remove the  shampoo.                           4.  Use CHG as you would any other liquid soap.  You can apply chg directly  to the skin and wash                       Gently with a scrungie or clean washcloth.  5.  Apply the CHG Soap to your body ONLY FROM THE NECK DOWN.   Do not use on face/ open                           Wound or open sores. Avoid contact with eyes, ears mouth and genitals (private parts).                       Wash face,  Genitals (private parts) with your normal soap.             6.  Wash thoroughly, paying special attention to the area where your surgery  will be performed.  7.  Thoroughly rinse your body with warm water from the neck down.  8.  DO NOT shower/wash with your normal soap after using and rinsing off  the CHG Soap.                9.  Pat yourself dry with a clean towel.            10.  Wear clean pajamas.            11.  Place clean sheets on your bed the night of your first shower and do not  sleep with pets. Day of Surgery : Do not apply any lotions/deodorants the morning of surgery.  Please wear clean clothes to the hospital/surgery center.  FAILURE TO FOLLOW THESE INSTRUCTIONS MAY RESULT IN THE CANCELLATION OF YOUR  SURGERY PATIENT SIGNATURE_________________________________  NURSE SIGNATURE__________________________________  ________________________________________________________________________   Bethany Adkins  An incentive spirometer is a tool that can help keep your lungs clear and active. This tool measures how well you are filling your lungs with each breath. Taking long deep breaths may help reverse or decrease the chance of developing breathing (pulmonary) problems (especially infection) following:  A long period of time when you are unable to  move or be active. BEFORE THE PROCEDURE   If the spirometer includes an indicator to show your best effort, your nurse or respiratory therapist will set it to a desired goal.  If possible, sit up straight or lean slightly forward. Try not to slouch.  Hold the incentive spirometer in an upright position. INSTRUCTIONS FOR USE  1. Sit on the edge of your bed if possible, or sit up as far as you can in bed or on a chair. 2. Hold the incentive spirometer in an upright position. 3. Breathe out normally. 4. Place the mouthpiece in your mouth and seal your lips tightly around it. 5. Breathe in slowly and as deeply as possible, raising the piston or the ball toward the top of the column. 6. Hold your breath for 3-5 seconds or for as long as possible. Allow the piston or ball to fall to the bottom of the column. 7. Remove the mouthpiece from your mouth and breathe out normally. 8. Rest for a few seconds and repeat Steps 1 through 7 at least 10 times every 1-2 hours when you are awake. Take your time and take a few normal breaths between deep breaths. 9. The spirometer may include an indicator to show your best effort. Use the indicator as a goal to work toward during each repetition. 10. After each set of 10 deep breaths, practice coughing to be sure your lungs are clear. If you have an incision (the cut made at the time of surgery), support your  incision when coughing by placing a pillow or rolled up towels firmly against it. Once you are able to get out of bed, walk around indoors and cough well. You may stop using the incentive spirometer when instructed by your caregiver.  RISKS AND COMPLICATIONS  Take your time so you do not get dizzy or light-headed.  If you are in pain, you may need to take or ask for pain medication before doing incentive spirometry. It is harder to take a deep breath if you are having pain. AFTER USE  Rest and breathe slowly and easily.  It can be helpful to keep track of a log of your progress. Your caregiver can provide you with a simple table to help with this. If you are using the spirometer at home, follow these instructions: [place] IF:   You are having difficultly using the spirometer.  You have trouble using the spirometer as often as instructed.  Your pain medication is not giving enough relief while using the spirometer.  You develop fever of 100.5 F (38.1 C) or higher. SEEK IMMEDIATE MEDICAL CARE IF:   You cough up bloody sputum that had not been present before.  You develop fever of 102 F (38.9 C) or greater.  You develop worsening pain at or near the incision site. MAKE SURE YOU:   Understand these instructions.  Will watch your condition.  Will get help right away if you are not doing well or get worse. Document Released: 10/12/2006 Document Revised: 08/24/2011 Document Reviewed: 12/13/2006 [employer] Patient Information 2014 [employer], [place].   ________________________________________________________________________  WHAT IS A BLOOD TRANSFUSION? Blood Transfusion Information  A transfusion is the replacement of blood or some of its parts. Blood is made up of multiple cells which provide different functions.  Red blood cells carry oxygen and are used for blood loss replacement.  White blood cells fight against infection.  Platelets control bleeding.  Plasma  helps clot blood.  Other blood products are available  for specialized needs, such as hemophilia or other clotting disorders. BEFORE THE TRANSFUSION  Who gives blood for transfusions?   Healthy volunteers who are fully evaluated to make sure their blood is safe. This is blood bank blood. Transfusion therapy is the safest it has ever been in the practice of medicine. Before blood is taken from a donor, a complete history is taken to make sure that person has no history of diseases nor engages in risky social behavior (examples are intravenous drug use or sexual activity with multiple partners). The donor's travel history is screened to minimize risk of transmitting infections, such as malaria. The donated blood is tested for signs of infectious diseases, such as HIV and hepatitis. The blood is then tested to be sure it is compatible with you in order to minimize the chance of a transfusion reaction. If you or a relative donates blood, this is often done in anticipation of surgery and is not appropriate for emergency situations. It takes many days to process the donated blood. RISKS AND COMPLICATIONS Although transfusion therapy is very safe and saves many lives, the main dangers of transfusion include:   Getting an infectious disease.  Developing a transfusion reaction. This is an allergic reaction to something in the blood you were given. Every precaution is taken to prevent this. The decision to have a blood transfusion has been considered carefully by your caregiver before blood is given. Blood is not given unless the benefits outweigh the risks. AFTER THE TRANSFUSION  Right after receiving a blood transfusion, you will usually feel much better and more energetic. This is especially true if your red blood cells have gotten low (anemic). The transfusion raises the level of the red blood cells which carry oxygen, and this usually causes an energy increase.  The nurse administering the transfusion  will monitor you carefully for complications. HOME CARE INSTRUCTIONS  No special instructions are needed after a transfusion. You may find your energy is better. Speak with your caregiver about any limitations on activity for underlying diseases you may have. SEEK MEDICAL CARE IF:   Your condition is not improving after your transfusion.  You develop redness or irritation at the intravenous (IV) site. SEEK IMMEDIATE MEDICAL CARE IF:  Any of the following symptoms occur over the next 12 hours:  Shaking chills.  You have a temperature by mouth above 102 F (38.9 C), not controlled by medicine.  Chest, back, or muscle pain.  People around you feel you are not acting correctly or are confused.  Shortness of breath or difficulty breathing.  Dizziness and fainting.  You get a rash or develop hives.  You have a decrease in urine output.  Your urine turns a dark color or changes to pink, red, or brown. Any of the following symptoms occur over the next 10 days:  You have a temperature by mouth above 102 F (38.9 C), not controlled by medicine.  Shortness of breath.  Weakness after normal activity.  The white part of the eye turns yellow (jaundice).  You have a decrease in the amount of urine or are urinating less often.  Your urine turns a dark color or changes to pink, red, or brown. Document Released: 05/29/2000 Document Revised: 08/24/2011 Document Reviewed: 01/16/2008 [facility] Patient Information 2014 [place], [place].  _______________________________________________________________________

## 2019-08-01 NOTE — Patient Instructions (Signed)
Preparing for your Surgery  Plan for surgery on August 03, 2019 with Dr. Everitt Amber at [place] will be scheduled for a robotic assisted total laparoscopic hysterectomy (removal of the uterus and cervix), bilateral salpingo-oophorectomy (removal of both fallopian tubes and ovaries), possible staging if cancer identified, possible mini laparotomy for bilateral complex ovarian masses.   Pre-operative Testing -You will receive a phone call from presurgical testing at [facility] if you have not received a call already to to discuss pre-op instructions over the phone and to arrange for a lab appointment and COVID test.   -Bring your insurance card, copy of an advanced directive if applicable, medication list  -At that visit, you will be asked to sign a consent for a possible blood transfusion in case a transfusion becomes necessary during surgery.  The need for a blood transfusion is rare but having consent is a necessary part of your care.     -You should not be taking blood thinners or aspirin at least ten days prior to surgery unless instructed by your surgeon.  -Do not take supplements such as fish oil (omega 3), red yeast rice, tumeric before your surgery.   Day Before Surgery at [place] will be asked to take in a light diet the day before surgery.  Avoid carbonated beverages.  You will be advised to have nothing to eat or drink after midnight the evening before.    Eat a light diet the day before surgery.  Examples including soups, broths, toast, yogurt, mashed potatoes.  Things to avoid include carbonated beverages (fizzy beverages), raw fruits and raw vegetables, or beans.   If your bowels are filled with gas, your surgeon will have difficulty visualizing your pelvic organs which increases your surgical risks.  Your role in recovery Your role is to become active as soon as directed by your doctor, while still giving yourself time to heal.  Rest when you feel  tired. You will be asked to do the following in order to speed your recovery:  - Cough and breathe deeply. This helps to clear and expand your lungs and can prevent pneumonia after surgery.  - Industry. Do mild physical activity. Walking or moving your legs help your circulation and body functions return to normal. Do not try to get up or walk alone the first time after surgery.   -If you develop swelling on one leg or the other, pain in the back of your leg, redness/warmth in one of your legs, please call the office or go to the Emergency Room to have a doppler to rule out a blood clot. For shortness of breath, chest pain-seek care in the Emergency Room as soon as possible. - Actively manage your pain. Managing your pain lets you move in comfort. We will ask you to rate your pain on a scale of zero to 10. It is your responsibility to tell your doctor or nurse where and how much you hurt so your pain can be treated.  Special Considerations -If you are diabetic, you may be placed on insulin after surgery to have closer control over your blood sugars to promote healing and recovery.  This does not mean that you will be discharged on insulin.  If applicable, your oral antidiabetics will be resumed when you are tolerating a solid diet.  -Your final pathology results from surgery should be available around one week after surgery and the results will be relayed to you  when available.  -Dr. Lahoma Crocker is the surgeon that assists your GYN Oncologist with surgery.  If you end up staying the night, the next day after your surgery you will either see Dr. Denman George, Dr. Berline Lopes, or Dr. Lahoma Crocker.  -FMLA forms can be faxed to [phone] and please allow 5-7 business days for completion.  Pain Management After Surgery -You have been prescribed your pain medication and bowel regimen medications before surgery so that you can have these available when you are discharged from the  hospital. The pain medication is for use ONLY AFTER surgery and a new prescription will not be given.   -Make sure that you have Tylenol and Ibuprofen at home to use on a regular basis after surgery for pain control. We recommend alternating the medications every hour to six hours since they work differently and are processed in the body differently for pain relief.  -Review the attached handout on narcotic use and their risks and side effects.   Bowel Regimen -You have been prescribed Sennakot-S to take nightly to prevent constipation especially if you are taking the narcotic pain medication intermittently.  It is important to prevent constipation and drink adequate amounts of liquids. You can stop taking this medication when you are not taking pain medication and you are back on your normal bowel routine.  Risks of Surgery -Risks of surgery are low but include bleeding, infection (you will be given antibiotics during surgery to help prevent this), damage to surrounding structure, re-operation, and in rare cases death.   Blood Transfusion Information (For the consent to be signed before surgery)  We will be checking your blood type before surgery so in case of emergencies, we will know what type of blood you would need.                                            WHAT IS A BLOOD TRANSFUSION?  A transfusion is the replacement of blood or some of its parts. Blood is made up of multiple cells which provide different functions.  Red blood cells carry oxygen and are used for blood loss replacement.  White blood cells fight against infection.  Platelets control bleeding.  Plasma helps clot blood.  Other blood products are available for specialized needs, such as hemophilia or other clotting disorders. BEFORE THE TRANSFUSION  Who gives blood for transfusions?   You may be able to donate blood to be used at a later date on yourself (autologous donation).  Relatives can be asked to donate  blood. This is generally not any safer than if you have received blood from a stranger. The same precautions are taken to ensure safety when a relative's blood is donated.  Healthy volunteers who are fully evaluated to make sure their blood is safe. This is blood bank blood. Transfusion therapy is the safest it has ever been in the practice of medicine. Before blood is taken from a donor, a complete history is taken to make sure that person has no history of diseases nor engages in risky social behavior (examples are intravenous drug use or sexual activity with multiple partners). The donor's travel history is screened to minimize risk of transmitting infections, such as malaria. The donated blood is tested for signs of infectious diseases, such as HIV and hepatitis. The blood is then tested to be sure it is compatible with you  in order to minimize the chance of a transfusion reaction. If you or a relative donates blood, this is often done in anticipation of surgery and is not appropriate for emergency situations. It takes many days to process the donated blood. RISKS AND COMPLICATIONS Although transfusion therapy is very safe and saves many lives, the main dangers of transfusion include:   Getting an infectious disease.  Developing a transfusion reaction. This is an allergic reaction to something in the blood you were given. Every precaution is taken to prevent this. The decision to have a blood transfusion has been considered carefully by your caregiver before blood is given. Blood is not given unless the benefits outweigh the risks.  AFTER SURGERY INSTRUCTIONS  Return to work: 4-6 weeks if applicable  Activity: 1. Be up and out of the bed during the day.  Take a nap if needed.  You may walk up steps but be careful and use the hand rail.  Stair climbing will tire you more than you think, you may need to stop part way and rest.   2. No lifting or straining for 6 weeks over 10 pounds. No pushing,  pulling, straining for 6 weeks.  3. No driving for 1 week(s).  Do not drive if you are taking narcotic pain medicine.   4. You can shower as soon as the next day after surgery. Shower daily.  Use soap and water on your incision and pat dry; don't rub.  No tub baths or submerging your body in water until cleared by your surgeon. If you have the soap that was given to you by pre-surgical testing that was used before surgery, you do not need to use it afterwards because this can irritate your incisions.   5. No sexual activity and nothing in the vagina for 8 weeks.  6. You may experience a small amount of clear drainage from your incisions, which is normal.  If the drainage persists, increases, or changes color please call the office.  7. Do not use creams, lotions, or ointments such as neosporin on your incisions after surgery until advised by your surgeon because they can cause removal of the dermabond glue on your incisions.    8. You may experience vaginal spotting after surgery or around the 6-8 week mark from surgery when the stitches at the top of the vagina begin to dissolve.  The spotting is normal but if you experience heavy bleeding, call our office.  9. Take Tylenol or ibuprofen first for pain and only use narcotic pain medication for severe pain not relieved by the Tylenol or Ibuprofen.  Monitor your Tylenol intake to a max of 4,000 mg.  Diet: 1. Low sodium Heart Healthy Diet is recommended.  2. It is safe to use a laxative, such as Miralax or Colace, if you have difficulty moving your bowels. You can take Sennakot at bedtime every evening to keep bowel movements regular and to prevent constipation.    Wound Care: 1. Keep clean and dry.  Shower daily.  Reasons to call the Doctor:  Fever - Oral temperature greater than 100.4 degrees Fahrenheit  Foul-smelling vaginal discharge  Difficulty urinating  Nausea and vomiting  Increased pain at the site of the incision that is  unrelieved with pain medicine.  Difficulty breathing with or without chest pain  New calf pain especially if only on one side  Sudden, continuing increased vaginal bleeding with or without clots.   Contacts: For questions or concerns you should contact:  Dr. Everitt Amber at [phone]  Joylene John, NP at [phone]  After Hours: call [phone] and have the GYN Oncologist paged/contacted

## 2019-08-02 ENCOUNTER — Other Ambulatory Visit (HOSPITAL_COMMUNITY): Payer: BC Managed Care – PPO

## 2019-08-02 ENCOUNTER — Other Ambulatory Visit: Payer: Self-pay

## 2019-08-02 ENCOUNTER — Ambulatory Visit: Payer: BC Managed Care – PPO | Admitting: Obstetrics & Gynecology

## 2019-08-02 ENCOUNTER — Encounter (HOSPITAL_COMMUNITY)
Admission: RE | Admit: 2019-08-02 | Discharge: 2019-08-02 | Disposition: A | Discharge status: Home / Self Care | Payer: BC Managed Care – PPO | Source: Ambulatory Visit | Attending: Gynecologic Oncology | Admitting: Gynecologic Oncology

## 2019-08-02 ENCOUNTER — Encounter (HOSPITAL_COMMUNITY): Payer: Self-pay

## 2019-08-02 ENCOUNTER — Telehealth: Payer: Self-pay

## 2019-08-02 DIAGNOSIS — Z01812 Encounter for preprocedural laboratory examination: Secondary | ICD-10-CM | POA: Diagnosis present

## 2019-08-02 HISTORY — DX: Essential (primary) hypertension: I10

## 2019-08-02 LAB — CBC
HCT: 43.1 % (ref 36.0–46.0)
Hemoglobin: 14.5 g/dL (ref 12.0–15.0)
MCH: 30.7 pg (ref 26.0–34.0)
MCHC: 33.6 g/dL (ref 30.0–36.0)
MCV: 91.3 fL (ref 80.0–100.0)
Platelets: 252 10*3/uL (ref 150–400)
RBC: 4.72 MIL/uL (ref 3.87–5.11)
RDW: 11.9 % (ref 11.5–15.5)
WBC: 5.4 10*3/uL (ref 4.0–10.5)
nRBC: 0 % (ref 0.0–0.2)

## 2019-08-02 LAB — COMPREHENSIVE METABOLIC PANEL
ALT: 15 U/L (ref 0–44)
AST: 18 U/L (ref 15–41)
Albumin: 4.1 g/dL (ref 3.5–5.0)
Alkaline Phosphatase: 71 U/L (ref 38–126)
Anion gap: 8 (ref 5–15)
BUN: 21 mg/dL — ABNORMAL HIGH (ref 6–20)
CO2: 29 mmol/L (ref 22–32)
Calcium: 9.1 mg/dL (ref 8.9–10.3)
Chloride: 102 mmol/L (ref 98–111)
Creatinine, Ser: 0.92 mg/dL (ref 0.44–1.00)
GFR calc Af Amer: >60 mL/min (ref >60)
GFR calc non Af Amer: >60 mL/min (ref >60)
Glucose, Bld: 102 mg/dL — ABNORMAL HIGH (ref 70–99)
Potassium: 4 mmol/L (ref 3.5–5.1)
Sodium: 139 mmol/L (ref 135–145)
Total Bilirubin: 0.5 mg/dL (ref 0.3–1.2)
Total Protein: 6.8 g/dL (ref 6.5–8.1)

## 2019-08-02 LAB — URINALYSIS, ROUTINE W REFLEX MICROSCOPIC
Bilirubin Urine: NEGATIVE
Glucose, UA: NEGATIVE mg/dL
Hgb urine dipstick: NEGATIVE
Ketones, ur: NEGATIVE mg/dL
Nitrite: NEGATIVE
Protein, ur: NEGATIVE mg/dL
Specific Gravity, Urine: 1.011 (ref 1.005–1.030)
pH: 6 (ref 5.0–8.0)

## 2019-08-02 LAB — ABO/RH: ABO/RH(D): O NEG

## 2019-08-02 NOTE — Telephone Encounter (Signed)
Spoke with Bethany Adkins and she stated that she understood her pre op instructions.

## 2019-08-02 NOTE — Progress Notes (Signed)
PCP - Dr. Antony Contras Cardiologist - n/a  Chest x-ray -  EKG -  Stress Test -  ECHO -  Cardiac Cath -   Sleep Study -  CPAP -   Fasting Blood Sugar - n/a Checks Blood Sugar __0___ times a day  Blood Thinner Instructions:n/a Aspirin Instructions: Last Dose:  Anesthesia review:   Patient denies shortness of breath, fever, cough and chest pain at PAT appointment   Patient verbalized understanding of instructions that were given to them at the PAT appointment. Patient was also instructed that they will need to review over the PAT instructions again at home before surgery.

## 2019-08-03 ENCOUNTER — Ambulatory Visit (HOSPITAL_COMMUNITY): Payer: BC Managed Care – PPO | Admitting: Physician Assistant

## 2019-08-03 ENCOUNTER — Ambulatory Visit (HOSPITAL_COMMUNITY)
Admission: RE | Admit: 2019-08-03 | Discharge: 2019-08-03 | Disposition: A | Discharge status: Home / Self Care | Payer: BC Managed Care – PPO | Attending: Gynecologic Oncology | Admitting: Gynecologic Oncology

## 2019-08-03 ENCOUNTER — Ambulatory Visit (HOSPITAL_COMMUNITY): Payer: BC Managed Care – PPO | Admitting: Anesthesiology

## 2019-08-03 ENCOUNTER — Inpatient Hospital Stay: Payer: BC Managed Care – PPO | Admitting: Gynecologic Oncology

## 2019-08-03 ENCOUNTER — Encounter (HOSPITAL_BASED_OUTPATIENT_CLINIC_OR_DEPARTMENT_OTHER)
Admission: RE | Disposition: A | Discharge status: Home / Self Care | Payer: BC Managed Care – PPO | Source: Home / Self Care | Attending: Gynecologic Oncology

## 2019-08-03 ENCOUNTER — Encounter (HOSPITAL_COMMUNITY): Payer: Self-pay | Admitting: Gynecologic Oncology

## 2019-08-03 DIAGNOSIS — Z833 Family history of diabetes mellitus: Secondary | ICD-10-CM | POA: Insufficient documentation

## 2019-08-03 DIAGNOSIS — D0739 Carcinoma in situ of other female genital organs: Secondary | ICD-10-CM | POA: Diagnosis not present

## 2019-08-03 DIAGNOSIS — C561 Malignant neoplasm of right ovary: Secondary | ICD-10-CM

## 2019-08-03 DIAGNOSIS — R1900 Intra-abdominal and pelvic swelling, mass and lump, unspecified site: Secondary | ICD-10-CM | POA: Diagnosis present

## 2019-08-03 DIAGNOSIS — N838 Other noninflammatory disorders of ovary, fallopian tube and broad ligament: Secondary | ICD-10-CM | POA: Insufficient documentation

## 2019-08-03 DIAGNOSIS — N800 Endometriosis of uterus: Secondary | ICD-10-CM

## 2019-08-03 DIAGNOSIS — E7800 Pure hypercholesterolemia, unspecified: Secondary | ICD-10-CM | POA: Diagnosis not present

## 2019-08-03 DIAGNOSIS — D259 Leiomyoma of uterus, unspecified: Secondary | ICD-10-CM | POA: Insufficient documentation

## 2019-08-03 DIAGNOSIS — Z8249 Family history of ischemic heart disease and other diseases of the circulatory system: Secondary | ICD-10-CM | POA: Insufficient documentation

## 2019-08-03 DIAGNOSIS — I10 Essential (primary) hypertension: Secondary | ICD-10-CM | POA: Diagnosis not present

## 2019-08-03 DIAGNOSIS — Z87891 Personal history of nicotine dependence: Secondary | ICD-10-CM | POA: Insufficient documentation

## 2019-08-03 DIAGNOSIS — N9489 Other specified conditions associated with female genital organs and menstrual cycle: Secondary | ICD-10-CM

## 2019-08-03 DIAGNOSIS — D4959 Neoplasm of unspecified behavior of other genitourinary organ: Secondary | ICD-10-CM | POA: Diagnosis present

## 2019-08-03 HISTORY — PX: ROBOTIC ASSISTED TOTAL HYSTERECTOMY WITH BILATERAL SALPINGO OOPHERECTOMY: SHX6086

## 2019-08-03 LAB — TYPE AND SCREEN
ABO/RH(D): O NEG
Antibody Screen: NEGATIVE

## 2019-08-03 SURGERY — HYSTERECTOMY, TOTAL, ROBOT-ASSISTED, LAPAROSCOPIC, WITH BILATERAL SALPINGO-OOPHORECTOMY
Anesthesia: General | Laterality: Bilateral

## 2019-08-03 MED ORDER — ACETAMINOPHEN 500 MG PO TABS
1000.0000 mg | ORAL_TABLET | Freq: Once | ORAL | Status: AC
  Administered 2019-08-03: 1000 mg via ORAL
  Filled 2019-08-03: qty 2

## 2019-08-03 MED ORDER — KETOROLAC TROMETHAMINE 30 MG/ML IJ SOLN
INTRAMUSCULAR | Status: AC
  Filled 2019-08-03: qty 1

## 2019-08-03 MED ORDER — OXYCODONE HCL 5 MG PO TABS: 5.0000 mg | ORAL_TABLET | Freq: Once | ORAL | Status: DC | PRN

## 2019-08-03 MED ORDER — GABAPENTIN 300 MG PO CAPS
300.0000 mg | ORAL_CAPSULE | ORAL | Status: AC
  Administered 2019-08-03: 300 mg via ORAL
  Filled 2019-08-03: qty 1

## 2019-08-03 MED ORDER — FENTANYL CITRATE (PF) 250 MCG/5ML IJ SOLN
INTRAMUSCULAR | Status: AC
  Filled 2019-08-03: qty 5

## 2019-08-03 MED ORDER — CEFAZOLIN SODIUM-DEXTROSE 2-4 GM/100ML-% IV SOLN
2.0000 g | INTRAVENOUS | Status: AC
  Administered 2019-08-03: 2 g via INTRAVENOUS
  Filled 2019-08-03: qty 100

## 2019-08-03 MED ORDER — BUPIVACAINE HCL 0.25 % IJ SOLN
INTRAMUSCULAR | Status: AC
  Filled 2019-08-03: qty 1

## 2019-08-03 MED ORDER — SODIUM CHLORIDE 0.9% FLUSH: 3.0000 mL | Freq: Two times a day (BID) | INTRAVENOUS | Status: DC

## 2019-08-03 MED ORDER — KETAMINE HCL 10 MG/ML IJ SOLN
INTRAMUSCULAR | Status: AC
  Filled 2019-08-03: qty 1

## 2019-08-03 MED ORDER — FENTANYL CITRATE (PF) 250 MCG/5ML IJ SOLN
INTRAMUSCULAR | Status: DC | PRN
  Administered 2019-08-03 (×2): 50 ug via INTRAVENOUS
  Administered 2019-08-03: 100 ug via INTRAVENOUS

## 2019-08-03 MED ORDER — CELECOXIB 200 MG PO CAPS
400.0000 mg | ORAL_CAPSULE | ORAL | Status: AC
  Administered 2019-08-03: 400 mg via ORAL
  Filled 2019-08-03: qty 2

## 2019-08-03 MED ORDER — LACTATED RINGERS IV SOLN: INTRAVENOUS | Status: DC

## 2019-08-03 MED ORDER — ONDANSETRON HCL 4 MG/2ML IJ SOLN
INTRAMUSCULAR | Status: AC
  Filled 2019-08-03: qty 2

## 2019-08-03 MED ORDER — PROPOFOL 10 MG/ML IV BOLUS
INTRAVENOUS | Status: DC | PRN
  Administered 2019-08-03: 120 mg via INTRAVENOUS

## 2019-08-03 MED ORDER — SCOPOLAMINE 1 MG/3DAYS TD PT72: 1.0000 | MEDICATED_PATCH | TRANSDERMAL | Status: DC

## 2019-08-03 MED ORDER — PROPOFOL 10 MG/ML IV BOLUS
INTRAVENOUS | Status: AC
  Filled 2019-08-03: qty 20

## 2019-08-03 MED ORDER — OXYCODONE HCL 5 MG PO TABS: 5.0000 mg | ORAL_TABLET | ORAL | Status: DC | PRN

## 2019-08-03 MED ORDER — DEXAMETHASONE SODIUM PHOSPHATE 10 MG/ML IJ SOLN
INTRAMUSCULAR | Status: AC
  Filled 2019-08-03: qty 1

## 2019-08-03 MED ORDER — SCOPOLAMINE 1 MG/3DAYS TD PT72
1.0000 | MEDICATED_PATCH | Freq: Once | TRANSDERMAL | Status: DC
  Administered 2019-08-03: 10:00:00 1.5 mg via TRANSDERMAL
  Filled 2019-08-03: qty 1

## 2019-08-03 MED ORDER — BUPIVACAINE HCL 0.25 % IJ SOLN
INTRAMUSCULAR | Status: DC | PRN
  Administered 2019-08-03: 20 mL

## 2019-08-03 MED ORDER — ROCURONIUM BROMIDE 10 MG/ML (PF) SYRINGE
PREFILLED_SYRINGE | INTRAVENOUS | Status: AC
  Filled 2019-08-03: qty 10

## 2019-08-03 MED ORDER — ENOXAPARIN SODIUM 40 MG/0.4ML ~~LOC~~ SOLN
40.0000 mg | SUBCUTANEOUS | Status: AC
  Administered 2019-08-03: 40 mg via SUBCUTANEOUS
  Filled 2019-08-03: qty 0.4

## 2019-08-03 MED ORDER — OXYCODONE HCL 5 MG/5ML PO SOLN: 5.0000 mg | Freq: Once | ORAL | Status: DC | PRN

## 2019-08-03 MED ORDER — KETAMINE HCL 10 MG/ML IJ SOLN
INTRAMUSCULAR | Status: DC | PRN
  Administered 2019-08-03: 30 mg via INTRAVENOUS

## 2019-08-03 MED ORDER — MIDAZOLAM HCL 5 MG/5ML IJ SOLN
INTRAMUSCULAR | Status: DC | PRN
  Administered 2019-08-03: 2 mg via INTRAVENOUS

## 2019-08-03 MED ORDER — MORPHINE SULFATE (PF) 4 MG/ML IV SOLN: 2.0000 mg | INTRAVENOUS | Status: DC | PRN

## 2019-08-03 MED ORDER — LIDOCAINE HCL 2 % IJ SOLN
INTRAMUSCULAR | Status: AC
  Filled 2019-08-03: qty 20

## 2019-08-03 MED ORDER — LACTATED RINGERS IR SOLN
Status: DC | PRN
  Administered 2019-08-03: 1000 mL

## 2019-08-03 MED ORDER — MIDAZOLAM HCL 2 MG/2ML IJ SOLN
INTRAMUSCULAR | Status: AC
  Filled 2019-08-03: qty 2

## 2019-08-03 MED ORDER — ACETAMINOPHEN 325 MG PO TABS: 650.0000 mg | ORAL_TABLET | ORAL | Status: DC | PRN

## 2019-08-03 MED ORDER — PROMETHAZINE HCL 25 MG/ML IJ SOLN: 6.2500 mg | INTRAMUSCULAR | Status: DC | PRN

## 2019-08-03 MED ORDER — SODIUM CHLORIDE 0.9 % IV SOLN: 250.0000 mL | INTRAVENOUS | Status: DC | PRN

## 2019-08-03 MED ORDER — EPHEDRINE 5 MG/ML INJ
INTRAVENOUS | Status: AC
  Filled 2019-08-03: qty 10

## 2019-08-03 MED ORDER — DEXAMETHASONE SODIUM PHOSPHATE 4 MG/ML IJ SOLN
4.0000 mg | INTRAMUSCULAR | Status: AC
  Administered 2019-08-03: 8 mg via INTRAVENOUS

## 2019-08-03 MED ORDER — ROCURONIUM BROMIDE 100 MG/10ML IV SOLN
INTRAVENOUS | Status: DC | PRN
  Administered 2019-08-03: 50 mg via INTRAVENOUS
  Administered 2019-08-03: 10 mg via INTRAVENOUS
  Administered 2019-08-03: 20 mg via INTRAVENOUS

## 2019-08-03 MED ORDER — SUGAMMADEX SODIUM 200 MG/2ML IV SOLN
INTRAVENOUS | Status: DC | PRN
  Administered 2019-08-03: 125 mg via INTRAVENOUS

## 2019-08-03 MED ORDER — HYDROMORPHONE HCL 1 MG/ML IJ SOLN: 0.2500 mg | INTRAMUSCULAR | Status: DC | PRN

## 2019-08-03 MED ORDER — LIDOCAINE HCL (CARDIAC) PF 100 MG/5ML IV SOSY
PREFILLED_SYRINGE | INTRAVENOUS | Status: DC | PRN
  Administered 2019-08-03: 80 mg via INTRAVENOUS

## 2019-08-03 MED ORDER — LIDOCAINE 20MG/ML (2%) 15 ML SYRINGE OPTIME
INTRAMUSCULAR | Status: DC | PRN
  Administered 2019-08-03: 1.5 mg/kg/h via INTRAVENOUS

## 2019-08-03 MED ORDER — ACETAMINOPHEN 650 MG RE SUPP
650.0000 mg | RECTAL | Status: DC | PRN
  Filled 2019-08-03: qty 1

## 2019-08-03 MED ORDER — EPHEDRINE SULFATE-NACL 50-0.9 MG/10ML-% IV SOSY
PREFILLED_SYRINGE | INTRAVENOUS | Status: DC | PRN
  Administered 2019-08-03 (×2): 5 mg via INTRAVENOUS

## 2019-08-03 MED ORDER — LIDOCAINE 2% (20 MG/ML) 5 ML SYRINGE
INTRAMUSCULAR | Status: AC
  Filled 2019-08-03: qty 5

## 2019-08-03 MED ORDER — KETOROLAC TROMETHAMINE 30 MG/ML IJ SOLN
30.0000 mg | Freq: Once | INTRAMUSCULAR | Status: AC | PRN
  Administered 2019-08-03: 13:00:00 30 mg via INTRAVENOUS

## 2019-08-03 MED ORDER — ONDANSETRON HCL 4 MG/2ML IJ SOLN
INTRAMUSCULAR | Status: DC | PRN
  Administered 2019-08-03: 4 mg via INTRAVENOUS

## 2019-08-03 MED ORDER — SODIUM CHLORIDE 0.9% FLUSH: 3.0000 mL | INTRAVENOUS | Status: DC | PRN

## 2019-08-03 MED ORDER — HYDROMORPHONE HCL 1 MG/ML IJ SOLN
INTRAMUSCULAR | Status: AC
  Administered 2019-08-03: 0.5 mg via INTRAVENOUS
  Filled 2019-08-03: qty 1

## 2019-08-03 MED ORDER — ACETAMINOPHEN 500 MG PO TABS: 1000.0000 mg | ORAL_TABLET | ORAL | Status: DC

## 2019-08-03 SURGICAL SUPPLY — 69 items
ADH SKN CLS APL DERMABOND .7 (GAUZE/BANDAGES/DRESSINGS) ×1
AGENT HMST KT MTR STRL THRMB (HEMOSTASIS)
APL ESCP 34 STRL LF DISP (HEMOSTASIS)
APPLICATOR SURGIFLO ENDO (HEMOSTASIS) IMPLANT
BACTOSHIELD CHG 4% 4OZ (MISCELLANEOUS) ×1
BAG LAPAROSCOPIC 12 15 PORT 16 (BASKET) IMPLANT
BAG RETRIEVAL 12/15 (BASKET) ×2
BAG SPEC RTRVL LRG 6X4 10 (ENDOMECHANICALS) ×1
BLADE SURG SZ10 CARB STEEL (BLADE) IMPLANT
COVER BACK TABLE 60X90IN (DRAPES) ×2 IMPLANT
COVER TIP SHEARS 8 DVNC (MISCELLANEOUS) ×1 IMPLANT
COVER TIP SHEARS 8MM DA VINCI (MISCELLANEOUS) ×1
COVER WAND RF STERILE (DRAPES) IMPLANT
DECANTER SPIKE VIAL GLASS SM (MISCELLANEOUS) ×1 IMPLANT
DERMABOND ADVANCED (GAUZE/BANDAGES/DRESSINGS) ×1
DERMABOND ADVANCED .7 DNX12 (GAUZE/BANDAGES/DRESSINGS) ×1 IMPLANT
DRAPE ARM DVNC X/XI (DISPOSABLE) ×4 IMPLANT
DRAPE COLUMN DVNC XI (DISPOSABLE) ×1 IMPLANT
DRAPE DA VINCI XI ARM (DISPOSABLE) ×4
DRAPE DA VINCI XI COLUMN (DISPOSABLE) ×1
DRAPE SHEET LG 3/4 BI-LAMINATE (DRAPES) ×2 IMPLANT
DRAPE SURG IRRIG POUCH 19X23 (DRAPES) ×2 IMPLANT
DRSG OPSITE POSTOP 4X6 (GAUZE/BANDAGES/DRESSINGS) IMPLANT
DRSG OPSITE POSTOP 4X8 (GAUZE/BANDAGES/DRESSINGS) IMPLANT
ELECT REM PT RETURN 15FT ADLT (MISCELLANEOUS) ×2 IMPLANT
GLOVE BIO SURGEON STRL SZ 6 (GLOVE) ×8 IMPLANT
GLOVE BIO SURGEON STRL SZ 6.5 (GLOVE) ×3 IMPLANT
GOWN STRL REUS W/ TWL LRG LVL3 (GOWN DISPOSABLE) ×4 IMPLANT
GOWN STRL REUS W/TWL LRG LVL3 (GOWN DISPOSABLE) ×8
HOLDER FOLEY CATH W/STRAP (MISCELLANEOUS) ×1 IMPLANT
IRRIG SUCT STRYKERFLOW 2 WTIP (MISCELLANEOUS) ×2
IRRIGATION SUCT STRKRFLW 2 WTP (MISCELLANEOUS) ×1 IMPLANT
KIT PROCEDURE DA VINCI SI (MISCELLANEOUS)
KIT PROCEDURE DVNC SI (MISCELLANEOUS) IMPLANT
KIT TURNOVER KIT A (KITS) ×1 IMPLANT
MANIPULATOR UTERINE 4.5 ZUMI (MISCELLANEOUS) ×2 IMPLANT
NDL SPNL 18GX3.5 QUINCKE PK (NEEDLE) IMPLANT
NEEDLE HYPO 22GX1.5 SAFETY (NEEDLE) ×2 IMPLANT
NEEDLE SPNL 18GX3.5 QUINCKE PK (NEEDLE) IMPLANT
OBTURATOR OPTICAL STANDARD 8MM (TROCAR) ×1
OBTURATOR OPTICAL STND 8 DVNC (TROCAR) ×1
OBTURATOR OPTICALSTD 8 DVNC (TROCAR) ×1 IMPLANT
PACK ROBOT GYN CUSTOM WL (TRAY / TRAY PROCEDURE) ×2 IMPLANT
PAD POSITIONING PINK XL (MISCELLANEOUS) ×2 IMPLANT
PENCIL SMOKE EVACUATOR (MISCELLANEOUS) IMPLANT
PORT ACCESS TROCAR AIRSEAL 12 (TROCAR) ×1 IMPLANT
PORT ACCESS TROCAR AIRSEAL 5M (TROCAR) ×1
POUCH SPECIMEN RETRIEVAL 10MM (ENDOMECHANICALS) ×1 IMPLANT
SCRUB CHG 4% DYNA-HEX 4OZ (MISCELLANEOUS) ×1 IMPLANT
SEAL CANN UNIV 5-8 DVNC XI (MISCELLANEOUS) ×3 IMPLANT
SEAL XI 5MM-8MM UNIVERSAL (MISCELLANEOUS) ×4
SET TRI-LUMEN FLTR TB AIRSEAL (TUBING) ×2 IMPLANT
SPONGE LAP 18X18 RF (DISPOSABLE) IMPLANT
SURGIFLO W/THROMBIN 8M KIT (HEMOSTASIS) IMPLANT
SUT MNCRL AB 4-0 PS2 18 (SUTURE) IMPLANT
SUT PDS AB 1 TP1 96 (SUTURE) IMPLANT
SUT VIC AB 0 CT1 27 (SUTURE) ×2
SUT VIC AB 0 CT1 27XBRD ANTBC (SUTURE) IMPLANT
SUT VIC AB 2-0 CT1 27 (SUTURE)
SUT VIC AB 2-0 CT1 TAPERPNT 27 (SUTURE) IMPLANT
SUT VICRYL 4-0 PS2 18IN ABS (SUTURE) ×4 IMPLANT
SYR 10ML LL (SYRINGE) IMPLANT
TOWEL OR NON WOVEN STRL DISP B (DISPOSABLE) ×2 IMPLANT
TRAP SPECIMEN MUCOUS 40CC (MISCELLANEOUS) IMPLANT
TRAY FOLEY MTR SLVR 16FR STAT (SET/KITS/TRAYS/PACK) ×2 IMPLANT
TROCAR XCEL NON-BLD 5MMX100MML (ENDOMECHANICALS) IMPLANT
UNDERPAD 30X36 HEAVY ABSORB (UNDERPADS AND DIAPERS) ×3 IMPLANT
WATER STERILE IRR 1000ML POUR (IV SOLUTION) ×2 IMPLANT
YANKAUER SUCT BULB TIP 10FT TU (MISCELLANEOUS) IMPLANT

## 2019-08-03 NOTE — Anesthesia Preprocedure Evaluation (Addendum)
Anesthesia Evaluation  Patient identified by MRN, date of birth, ID band Patient awake    Reviewed: Allergy & Precautions, NPO status , Patient's Chart, lab work & pertinent test results  History of Anesthesia Complications Negative for: history of anesthetic complications  Airway Mallampati: II  TM Distance: >3 FB Neck ROM: Full    Dental no notable dental hx.    Pulmonary former smoker,    Pulmonary exam normal        Cardiovascular hypertension, Normal cardiovascular exam     Neuro/Psych negative neurological ROS  negative psych ROS   GI/Hepatic negative GI ROS, Neg liver ROS,   Endo/Other  negative endocrine ROS  Renal/GU negative Renal ROS  negative genitourinary   Musculoskeletal negative musculoskeletal ROS (+)   Abdominal   Peds  Hematology negative hematology ROS (+)   Anesthesia Other Findings Day of surgery medications reviewed with patient.  Reproductive/Obstetrics BILATERAL COMPLEX OVARIAN MASSES                            Anesthesia Physical Anesthesia Plan  ASA: II  Anesthesia Plan: General   Post-op Pain Management:    Induction: Intravenous  PONV Risk Score and Plan: 4 or greater and Midazolam, Ondansetron, Dexamethasone, Treatment may vary due to age or medical condition and Scopolamine patch - Pre-op  Airway Management Planned: Oral ETT  Additional Equipment: None  Intra-op Plan:   Post-operative Plan: Extubation in OR  Informed Consent: I have reviewed the patients History and Physical, chart, labs and discussed the procedure including the risks, benefits and alternatives for the proposed anesthesia with the patient or authorized representative who has indicated his/her understanding and acceptance.     Dental advisory given  Plan Discussed with: CRNA  Anesthesia Plan Comments:        Anesthesia Quick Evaluation

## 2019-08-03 NOTE — Transfer of Care (Signed)
Immediate Anesthesia Transfer of Care Note  Patient: Bethany Adkins  Procedure(s) Performed: XI ROBOTIC ASSISTED TOTAL HYSTERECTOMY WITH BILATERAL SALPINGO OOPHORECTOMY (Bilateral )  Patient Location: PACU  Anesthesia Type:General  Level of Consciousness: drowsy, patient cooperative and responds to stimulation  Airway & Oxygen Therapy: Patient Spontanous Breathing and Patient connected to face mask oxygen  Post-op Assessment: Report given to RN and Post -op Vital signs reviewed and stable  Post vital signs: Reviewed and stable  Last Vitals:  Vitals Value Taken Time  BP    Temp    Pulse    Resp    SpO2      Last Pain:  Vitals:   08/03/19 0952  TempSrc:   PainSc: 0-No pain         Complications: No apparent anesthesia complications

## 2019-08-03 NOTE — Op Note (Signed)
OPERATIVE NOTE 08/03/19  Surgeon: Donaciano Eva   Assistants: Dr Lahoma Crocker (an MD assistant was necessary for tissue manipulation, management of robotic instrumentation, retraction and positioning due to the complexity of the case and hospital policies).   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 Left ovary enlarged to approximately 10cm with multiple cysts and solid areas.  The abdomen was otherwise normal with no ascites or carcinomatosis. There was a normal uterus.  Estimated Blood Loss:  20cc      Total IV Fluids: 600 ml         Specimens: uterus with left and right tube and ovary         Complications:  None; patient tolerated the procedure well.         Disposition: PACU - hemodynamically stable.  Procedure Details  The patient was seen in the Holding Room. The risks, benefits, complications, treatment options, and expected outcomes were discussed with the patient.  The patient concurred with the proposed plan, giving informed consent.  The site of surgery properly noted/marked. The patient was identified as Bethany Adkins and the procedure verified as a Robotic-assisted hysterectomy with bilateral salpingo oophorectomy. A Time Out was held and the above information confirmed.  After induction of anesthesia, the patient was draped and prepped in the usual sterile manner. Pt was placed in supine position after anesthesia and draped and prepped in the usual sterile manner. The abdominal  drape was placed after the CholoraPrep had been allowed to dry for 3 minutes.  Her arms were tucked to her side with all appropriate precautions.  The shoulders were stabilized with padded shoulder blocks applied to the acromium processes.  The patient was placed in the semi-lithotomy position in [place].  The perineum was prepped with Betadine. The patient was then prepped. Foley catheter was placed.  A sterile speculum was placed in the vagina.  The cervix was grasped with a single-tooth tenaculum and dilated with Kennon Rounds dilators.  The ZUMI uterine manipulator with a medium colpotomizer ring was placed without difficulty.  A pneum occluder balloon was placed over the manipulator.  OG tube placement was confirmed and to suction.   Next, a 5 mm skin incision was made 1 cm below the subcostal margin in the midclavicular line.  The 5 mm Optiview port and scope was used for direct entry.  Opening pressure was under 10 mm CO2.  The abdomen was insufflated and the findings were noted as above.   At this point and all points during the procedure, the patient's intra-abdominal pressure did not exceed 15 mmHg. Next, a 10 mm skin incision was made in the umbilicus and a right and left port was placed about 10 cm lateral to the robot port on the right and left side.  A fourth arm was placed in the left lower quadrant 2 cm above and superior and medial to the anterior superior iliac spine.  All ports were placed under direct visualization.  The patient was placed in steep Trendelenburg.  Bowel was folded away into the upper abdomen.  The robot was docked in  the normal manner.  Washings were obtained.   The hysterectomy was started after the round ligament on the right side was incised and the retroperitoneum was entered and the pararectal space was developed.  The ureter was noted to be on the medial leaf of the broad ligament.  The peritoneum above the ureter was incised and stretched and the infundibulopelvic  ligament was skeletonized, cauterized and cut.  Due to the large size of the right ovarian mass, it was amputated from the fundus by incising the utero-ovarial ligament and then placed in an endocatch bag in tact with no rupture. The posterior peritoneum was taken down to the level of the KOH ring.  The anterior peritoneum was also taken down.  The bladder flap was created to the level of the KOH ring.  The uterine artery on the right side was skeletonized, cauterized and cut in the normal manner.  A similar procedure was performed on the left and the left ovary was transected from the left uterine fundus to place in an endocatch bag for retrieval vaginally.  The colpotomy was made and the uterus, cervix, bilateral ovaries and tubes were amputated and delivered through the vagina.  Pedicles were inspected and excellent hemostasis was achieved.    The colpotomy at the vaginal cuff was closed with Vicryl on a CT1 needle in a running manner.  Irrigation was used and excellent hemostasis was achieved.    The frozen section revealed a borderline tumor.   At this point in the procedure was completed.  Robotic instruments were removed under direct visulaization.  The robot was undocked. The 10 mm ports were closed with Vicryl on a UR-5 needle and the fascia was closed with 0 Vicryl on a UR-5 needle.  The skin was closed with 4-0 Vicryl in a subcuticular manner.  Dermabond was applied.  Sponge, lap and needle counts correct x 2.  The patient was taken to the recovery room in stable condition.  The vagina was swabbed with  minimal bleeding noted.   All instrument and needle counts were correct x  3.   The patient was transferred to the recovery room in a running condition.  Donaciano Eva, MD

## 2019-08-03 NOTE — Discharge Instructions (Signed)
Return to work: 4 weeks (2 weeks with physical restrictions).  Activity: 1. Be up and out of the bed during the day.  Take a nap if needed.  You may walk up steps but be careful and use the hand rail.  Stair climbing will tire you more than you think, you may need to stop part way and rest.   2. No lifting or straining for 4 weeks.  3. No driving for 1 weeks.  Do Not drive if you are taking narcotic pain medicine.  4. Shower daily.  Use soap and water on your incision and pat dry; don't rub.   5. No sexual activity and nothing in the vagina for 8 weeks.  Medications:  - Take ibuprofen and tylenol first line for pain control. Take these regularly (every 6 hours) to decrease the build up of pain.  - If necessary, for severe pain not relieved by ibuprofen, contact Dr Rossi's office and you will be prescribed percocet.  - While taking percocet you should take sennakot every night to reduce the likelihood of constipation. If this causes diarrhea, stop its use.  Diet: 1. Low sodium Heart Healthy Diet is recommended.  2. It is safe to use a laxative if you have difficulty moving your bowels.   Wound Care: 1. Keep clean and dry.  Shower daily.  Reasons to call the Doctor:   Fever - Oral temperature greater than 100.4 degrees Fahrenheit  Foul-smelling vaginal discharge  Difficulty urinating  Nausea and vomiting  Increased pain at the site of the incision that is unrelieved with pain medicine.  Difficulty breathing with or without chest pain  New calf pain especially if only on one side  Sudden, continuing increased vaginal bleeding with or without clots.   Follow-up: 1. See Emma Rossi in 4 weeks.  Contacts: For questions or concerns you should contact:  Dr. Emma Rossi at [phone] After hours and on week-ends call [phone] and ask to speak to the physician on call for Gynecologic Oncology  After Your Surgery  The information in this section will tell you what  to expect after your surgery, both during your stay and after you leave. You will learn how to safely recover from your surgery. Write down any questions you have and be sure to ask your doctor or nurse.  What to Expect When you wake up after your surgery, you will be in the Post-Anesthesia Care Unit (PACU) or your recovery room. A nurse will be monitoring your body temperature, blood pressure, pulse, and oxygen levels. You may have a urinary catheter in your bladder to help monitor the amount of urine you are making. It should come out before you go home. You will also have compression boots on your lower legs to help your circulation. Your pain medication will be given through an IV line or in tablet form. If you are having pain, tell your nurse. Your nurse will tell you how to recover from your surgery. Below are examples of ways you can help yourself recover safely. . You will be encouraged to walk with the help of your nurse or physical therapist. We will give you medication to relieve pain. Walking helps reduce the risk for blood clots and pneumonia. It also helps to stimulate your bowels so they begin working again. . Use your incentive spirometer. This will help your lungs expand, which prevents pneumonia.   Commonly Asked Questions  Will I have pain after surgery? Yes, you will have some   pain after your surgery, especially in the first few days. Your doctor and nurse will ask you about your pain often. You will be given medication to manage your pain as needed. If your pain is not relieved, please tell your doctor or nurse. It is important to control your pain so you can cough, breathe deeply, use your incentive spirometer, and get out of bed and walk.  Will I be able to eat? Yes, you will be able to eat a regular diet or eat as tolerated. You should start with foods that are soft and easy to digest such as apple sauce and chicken noodle soup. Eat small meals frequently, and then advance  to regular foods. If you experience bloating, gas, or cramps, limit high-fiber foods, including whole grain breads and cereal, nuts, seeds, salads, fresh fruit, broccoli, cabbage, and cauliflower. Will I have pain when I am home? The length of time each person has pain or discomfort varies. You may still have some pain when you go home and will probably be taking pain medication. Follow the guidelines below. . Take your medications as directed and as needed. . Call your doctor if the medication prescribed for you doesn't relieve your pain. . Don't drive or drink alcohol while you're taking prescription pain medication. . As your incision heals, you will have less pain and need less pain medication. A mild pain reliever such as acetaminophen (Tylenol) or ibuprofen (Advil) will relieve aches and discomfort. However, large quantities of acetaminophen may be harmful to your liver. Don't take more acetaminophen than the amount directed on the bottle or as instructed by your doctor or nurse. . Pain medication should help you as you resume your normal activities. Take enough medication to do your exercises comfortably. Pain medication is most effective 30 to 45 minutes after taking it. . Keep track of when you take your pain medication. Taking it when your pain first begins is more effective than waiting for the pain to get worse. Pain medication may cause constipation (having fewer bowel movements than what is normal for you).  How can I prevent constipation? . Go to the bathroom at the same time every day. Your body will get used to going at that time. . If you feel the urge to go, don't put it off. Try to use the bathroom 5 to 15 minutes after meals. . After breakfast is a good time to move your bowels. The reflexes in your colon are strongest at this time. . Exercise, if you can. Walking is an excellent form of exercise. . Drink 8 (8-ounce) glasses (2 liters) of liquids daily, if you can. Drink  water, juices, soups, ice cream shakes, and other drinks that don't have caffeine. Drinks with caffeine, such as coffee and soda, pull fluid out of the body. . Slowly increase the fiber in your diet to 25 to 35 grams per day. Fruits, vegetables, whole grains, and cereals contain fiber. If you have an ostomy or have had recent bowel surgery, check with your doctor or nurse before making any changes in your diet. . Both over-the-counter and prescription medications are available to treat constipation. Start with 1 of the following over-the-counter medications first: o Docusate sodium (Colace) 100 mg. Take ___1__ capsules _2____ times a day. This is a stool softener that causes few side effects. Don't take it with mineral oil. o Polyethylene glycol (MiraLAX) 17 grams daily. o Senna (Senokot) 2 tablets at bedtime. This is a stimulant laxative, which can cause cramping. .   If you haven't had a bowel movement in 2 days, call your doctor or nurse.  Can I shower? Yes, you should shower 24 hours after your surgery. Be sure to shower every day. Taking a warm shower is relaxing and can help decrease muscle aches. Use soap when you shower and gently wash your incision. Pat the areas dry with a towel after showering, and leave your incision uncovered (unless there is drainage). Call your doctor if you see any redness or drainage from your incision. Don't take tub baths until you discuss it with your doctor at the first appointment after your surgery. How do I care for my incisions? You will have several small incisions on your abdomen. The incisions are closed with Steri-Strips or Dermabond. You may also have square white dressings on your incisions ([place]). You can remove these in the shower 24 hours after your surgery. You should clean your incisions with soap and water. If you go home with Steri-Strips on your incision, they will loosen and may fall off by themselves. If they haven't fallen off within 10  days, you can remove them. If you go home with Dermabond over your sutures (stitches), it will also loosen and peel off.  What are the most common symptoms after a hysterectomy? It's common for you to have some vaginal spotting or light bleeding. You should monitor this with a pad or a panty liner. If you have having heavy bleeding (bleeding through a pad or liner every 1 to 2 hours), call your doctor right away. It's also common to have some discomfort after surgery from the air that was pumped into your abdomen during surgery. To help with this, walk, drink plenty of liquids and make sure to take the stool softeners you received.  When is it safe for me to drive? You may resume driving 2 weeks after surgery, as long as you are not taking pain medication that may make you drowsy.  When can I resume sexual activity? Do not place anything in your vagina or have vaginal intercourse for 8 weeks after your surgery. Some people will need to wait longer than 8 weeks, so speak with your doctor before resuming sexual intercourse.  Will I be able to travel? Yes, you can travel. If you are traveling by plane within a few weeks after your surgery, make sure you get up and walk every hour. Be sure to stretch your legs, drink plenty of liquids, and keep your feet elevated when possible.  Will I need any supplies? Most people do not need any supplies after the surgery. In the rare case that you do need supplies, such as tubes or drains, your nurse will order them for you.  When can I return to work? The time it takes to return to work depends on the type of work you do, the type of surgery you had, and how fast your body heals. Most people can return to work about 2 to 4 weeks after the surgery.  What exercises can I do? Exercise will help you gain strength and feel better. Walking and stair climbing are excellent forms of exercise. Gradually increase the distance you walk. Climb stairs slowly, resting or  stopping as needed. Ask your doctor or nurse before starting more strenuous exercises.  When can I lift heavy objects? Most people should not lift anything heavier than 10 pounds (4.5 kilograms) for at least 4 weeks after surgery. Speak with your doctor about when you can do heavy lifting.    How can I cope with my feelings? After surgery for a serious illness, you may have new and upsetting feelings. Many people say they felt weepy, sad, worried, nervous, irritable, and angry at one time or another. You may find that you can't control some of these feelings. If this happens, it's a good idea to seek emotional support. The first step in coping is to talk about how you feel. Family and friends can help. Your nurse, doctor, and social worker can reassure, support, and guide you. It's always a good idea to let these professionals know how you, your family, and your friends are feeling emotionally. Many resources are available to patients and their families. Whether you're in the hospital or at home, the nurses, doctors, and social workers are here to help you and your family and friends handle the emotional aspects of your illness.  When is my first appointment after surgery? Your first appointment after surgery will be 2 to 4 weeks after surgery. Your nurse will give you instructions on how to make this appointment, including the phone number to call.  What if I have other questions? If you have any questions or concerns, please talk with your doctor or nurse. You can reach them Monday through Friday from 9:00 am to 5:00 pm. After 5:00 pm, during the weekend, and on holidays, call [phone] and ask for the doctor on call for your doctor.  . Have a temperature of 101 F (38.3 C) or higher . Have pain that does not get better with pain medication . Have redness, drainage, or swelling from your incisions

## 2019-08-03 NOTE — Interval H&P Note (Signed)
History and Physical Interval Note:  08/03/2019 9:34 AM  Bethany Adkins  has presented today for surgery, with the diagnosis of BILATERAL COMPLEX OVARIAN MASSES.  The various methods of treatment have been discussed with the patient and family. After consideration of risks, benefits and other options for treatment, the patient has consented to  Procedure(s): XI ROBOTIC ASSISTED TOTAL HYSTERECTOMY WITH BILATERAL SALPINGO OOPHORECTOMY, POSSIBLE MINILAPAROTOMY AND POSSIBLE STAGING (Bilateral) as a surgical intervention.  The patient's history has been reviewed, patient examined, no change in status, stable for surgery.  I have reviewed the patient's chart and labs.  Questions were answered to the patient's satisfaction.     Thereasa Solo

## 2019-08-03 NOTE — Anesthesia Procedure Notes (Signed)
Procedure Name: Intubation Date/Time: 08/03/2019 10:32 AM Performed by: Glory Buff, CRNA Pre-anesthesia Checklist: Patient identified, Emergency Drugs available, Suction available and Patient being monitored Patient Re-evaluated:Patient Re-evaluated prior to induction Oxygen Delivery Method: Circle system utilized Preoxygenation: Pre-oxygenation with 100% oxygen Induction Type: IV induction Ventilation: Mask ventilation without difficulty Laryngoscope Size: Miller and 3 Grade View: Grade I Tube type: Oral Tube size: 7.0 mm Number of attempts: 1 Airway Equipment and Method: Stylet and Oral airway Placement Confirmation: ETT inserted through vocal cords under direct vision,  positive ETCO2 and breath sounds checked- equal and bilateral Secured at: 21 cm Tube secured with: Tape Dental Injury: Teeth and Oropharynx as per pre-operative assessment

## 2019-08-03 NOTE — Anesthesia Postprocedure Evaluation (Signed)
Anesthesia Post Note  Patient: Bethany Adkins  Procedure(s) Performed: XI ROBOTIC ASSISTED TOTAL HYSTERECTOMY WITH BILATERAL SALPINGO OOPHORECTOMY (Bilateral )     Patient location during evaluation: PACU Anesthesia Type: General Level of consciousness: awake and alert and oriented Pain management: pain level controlled Vital Signs Assessment: post-procedure vital signs reviewed and stable Respiratory status: spontaneous breathing, nonlabored ventilation and respiratory function stable Cardiovascular status: blood pressure returned to baseline Postop Assessment: no apparent nausea or vomiting Anesthetic complications: no    Last Vitals:  Vitals:   08/03/19 1245 08/03/19 1315  BP: 136/76 (!) 141/92  Pulse: 66 66  Resp: 17 15  Temp: 36.4 C   SpO2: 100% 98%    Last Pain:  Vitals:   08/03/19 1330  TempSrc:   PainSc: [place]

## 2019-08-04 ENCOUNTER — Telehealth: Payer: Self-pay | Admitting: *Deleted

## 2019-08-04 NOTE — Telephone Encounter (Signed)
Pt states that she is feeling well this morning. Pt reports that most of her discomfort from her surgery is in her shoulders. Pt states that the pain has been under control with tylenol and ibuprofen, but she has oxycodone if she needs to take anything stronger for the pain. Pt is passing gas, took Senokot last night, but no BM yet, had bran cereal for breakfast today and is planning on going for a walk this afternoon. Pt encouraged to increase fluid intake to 64oz today. Pt states that her daughter will look at her incisions soon, but pt has not looked at them yet, pt has not noticed any drainage from incisions on her clothes. Pt has clinic number and states that she will call with any questions or concerns. Pt is aware of f/u appointment.

## 2019-08-07 ENCOUNTER — Telehealth: Payer: Self-pay | Admitting: *Deleted

## 2019-08-07 LAB — CYTOLOGY - NON PAP

## 2019-08-07 LAB — SURGICAL PATHOLOGY

## 2019-08-07 NOTE — Telephone Encounter (Signed)
Refill renewal request received for ibuprofen. I spoke with pt and she states that she does not need a refill for this medication.

## 2019-08-18 ENCOUNTER — Other Ambulatory Visit: Payer: Self-pay | Admitting: Family Medicine

## 2019-08-18 DIAGNOSIS — Z1231 Encounter for screening mammogram for malignant neoplasm of breast: Secondary | ICD-10-CM

## 2019-08-21 ENCOUNTER — Other Ambulatory Visit: Payer: Self-pay | Admitting: Oncology

## 2019-08-21 NOTE — Progress Notes (Signed)
Gynecologic Oncology Multi-Disciplinary Disposition Conference Note  Date of the Conference: 08/21/2019  Patient Name: Bethany Adkins  Referring Provider: Dr. Roselie Awkward Primary GYN Oncologist: Dr. 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 Disposition is to close follow up for 10 years.   This Multidisciplinary conference took place involving physicians from [place], [place], Radiation Oncology, Pathology, Radiology along with the Gynecologic Oncology Nurse Practitioner and RN.  Comprehensive assessment of the patient's malignancy, staging, need for surgery, chemotherapy, radiation therapy, and need for further testing were reviewed. Supportive measures, both inpatient and following discharge were also discussed. The recommended plan of care is documented. Greater than 35 minutes were spent correlating and coordinating this patient's care.

## 2019-08-22 ENCOUNTER — Other Ambulatory Visit: Payer: Self-pay

## 2019-08-22 ENCOUNTER — Encounter: Payer: Self-pay | Admitting: Gynecologic Oncology

## 2019-08-22 ENCOUNTER — Inpatient Hospital Stay: Payer: BC Managed Care – PPO | Attending: Gynecologic Oncology | Admitting: Gynecologic Oncology

## 2019-08-22 VITALS — BP 122/77 | HR 86 | Temp 98.2°F | Resp 17 | Ht 63.0 in | Wt 146.4 lb

## 2019-08-22 DIAGNOSIS — E7800 Pure hypercholesterolemia, unspecified: Secondary | ICD-10-CM | POA: Insufficient documentation

## 2019-08-22 DIAGNOSIS — Z90710 Acquired absence of both cervix and uterus: Secondary | ICD-10-CM

## 2019-08-22 DIAGNOSIS — D3912 Neoplasm of uncertain behavior of left ovary: Secondary | ICD-10-CM | POA: Insufficient documentation

## 2019-08-22 DIAGNOSIS — Z87891 Personal history of nicotine dependence: Secondary | ICD-10-CM | POA: Insufficient documentation

## 2019-08-22 DIAGNOSIS — I10 Essential (primary) hypertension: Secondary | ICD-10-CM | POA: Insufficient documentation

## 2019-08-22 DIAGNOSIS — Z79899 Other long term (current) drug therapy: Secondary | ICD-10-CM | POA: Insufficient documentation

## 2019-08-22 DIAGNOSIS — Z8249 Family history of ischemic heart disease and other diseases of the circulatory system: Secondary | ICD-10-CM | POA: Insufficient documentation

## 2019-08-22 DIAGNOSIS — D3911 Neoplasm of uncertain behavior of right ovary: Secondary | ICD-10-CM | POA: Insufficient documentation

## 2019-08-22 DIAGNOSIS — Z9079 Acquired absence of other genital organ(s): Secondary | ICD-10-CM | POA: Insufficient documentation

## 2019-08-22 DIAGNOSIS — Z90722 Acquired absence of ovaries, bilateral: Secondary | ICD-10-CM | POA: Insufficient documentation

## 2019-08-22 DIAGNOSIS — Z791 Long term (current) use of non-steroidal anti-inflammatories (NSAID): Secondary | ICD-10-CM | POA: Insufficient documentation

## 2019-08-22 DIAGNOSIS — Z833 Family history of diabetes mellitus: Secondary | ICD-10-CM | POA: Insufficient documentation

## 2019-08-22 NOTE — Progress Notes (Signed)
Follow-up Note: Gyn-Onc  Consult was requested by Dr. Roselie Awkward for the evaluation of Bethany Adkins 54 y.o. female  CC:  Chief Complaint  Patient presents with  . Neoplasm of left ovary with borderline malignant features    Post Op visit  . Neoplasm of right ovary with borderline malignant features    Assessment/Plan:  Bethany Adkins  is a 54 y.o.  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  Recommendation is for close follow-up with annual exams and CA 125 assessments every 12 months x 10 years.  I discussed the low risk for recurrence and the need for surveillance. I discussed anticipated symptoms for recurrence.  I counseled Ivah regarding postop care and resumption of normal activities.   HPI: Ms Bethany Adkins is a 54 year old P3 who was seen in consultation at the request of Dr Roselie Awkward for bilateral complex ovarian masses.   The patient reported first noting symptoms of abdominal pains on June 21, 2019.  She attributed this to stress from the events of [place] that had occurred that day.  However the pain and abdominal bloating continued and she eventually determined that this was less likely to be a psychologic phenomenon and instead saw her gynecologist Dr. Roselie Awkward on July 20, 2019.  At that time a transvaginal ultrasound scan was performed which revealed bilateral ovarian masses measuring on the right 13.8 x 6.6 x 14 cm containing solid components, complex cystic components with internal echogenicity and a few simple cystic components, and a complex multiloculated solid and cystic left ovarian mass measuring 8.1 x 5.4 x 5.8 cm containing solid components complex cystic components with internal echogenicity irregular septations and mild mural nodularity.  There was Doppler blood flow within the complicated components on color Doppler imaging.  The uterus itself  was unremarkable and measured 8.8 x 2.5 x 3.3 cm with a 2 mm endometrium.  Ca1 25 was drawn on 07/20/2019 and was normal at 25.3.  The patient is postmenopausal as of February 2020.  The patient is otherwise fairly healthy with no significant past medical history.  She is not treated routinely with medications.  She is never had prior surgery.  Gynecologic history significant for 3 prior vaginal births.  She has never had an abnormal Pap test.  Her family history is unremarkable for first and second degree relatives with malignancy however she does have some cousins who have cancer of unknown primary.  Her sister may have had a precancer of the uterus which was removed surgically.  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  Interval Hx:  On 08/03/19 she underwent robotic assisted total hysterectomy with BSO. Intraoperative findings were significant for the right ovary was grossly enlarged approximately 15 cm with multiple cysts and solid areas with torsion.  The left ovary was enlarged approximately 10 cm with multiple cysts and solid areas.  The abdomen was otherwise normal with no ascites or carcinomatosis.  There was a normal appearing uterus.. Surgery was uncomplicated and frozen section revealed low malignant potential tumor.  Final pathology revealed a micropapillary serous borderline tumor with microinvasion confined to the ovary.  There was no surface invasion or involvement or capsule rupture of either ovary.  The left ovary contained micro micropapillary serous borderline tumor with  no invasion identified.  Similarly there was no capsule rupture or surface involvement.  The uterus was benign..  Since surgery she has done well with no specific complaints.  Her case was reviewed at multidisciplinary conference including pathology review and plan was for close follow-up with annual  surveillance with tumor markers.  Current Meds:  Outpatient Encounter Medications as of 08/22/2019  Medication Sig  . ibuprofen (ADVIL) 800 MG tablet Take 1 tablet (800 mg total) by mouth every 8 (eight) hours as needed for moderate pain. For AFTER surgery  . Omega-3 Fatty Acids (FISH OIL) 1000 MG CAPS Take 1,000 mg by mouth at bedtime.   . senna-docusate (SENOKOT-S) 8.6-50 MG tablet Take 2 tablets by mouth at bedtime. For AFTER surgery, do not take if having diarrhea  . [DISCONTINUED] traMADol (ULTRAM) 50 MG tablet Take 1 tablet (50 mg total) by mouth every 6 (six) hours as needed for severe pain. For AFTER surgery, do not take and drive   No facility-administered encounter medications on file as of 08/22/2019.    Allergy: No Known Allergies  Social Hx:   Social History   Socioeconomic History  . Marital status: Single    Spouse name: Not on file  . Number of children: Not on file  . Years of education: Not on file  . Highest education level: Not on file  Occupational History  . Not on file  Tobacco Use  . Smoking status: Former Smoker    Years: 10.00    Types: Cigarettes    Quit date: 07/24/2008    Years since quitting: 11.0  . Smokeless tobacco: Never Used  Substance and Sexual Activity  . Alcohol use: No    Alcohol/week: 0.0 standard drinks  . Drug use: No  . Sexual activity: Yes    Partners: Male    Birth control/protection: Surgical    Comment: vasectomy  Other Topics Concern  . Not on file  Social History Narrative  . Not on file   Social Determinants of Health   Financial Resource Strain:   . Difficulty of Paying Living Expenses: Not on file  Food Insecurity:   . Worried About Charity fundraiser in the Last Year: Not on file  . Ran Out of Food in the Last Year: Not on file  Transportation Needs:   . Lack of Transportation (Medical): Not on file  . Lack of Transportation (Non-Medical): Not on file  Physical Activity:   . Days of Exercise per Week: Not on  file  . Minutes of Exercise per Session: Not on file  Stress:   . Feeling of Stress : Not on file  Social Connections:   . Frequency of Communication with Friends and Family: Not on file  . Frequency of Social Gatherings with Friends and Family: Not on file  . Attends Religious Services: Not on file  . Active Member of Clubs or Organizations: Not on file  . Attends Archivist Meetings: Not on file  . Marital Status: Not on file  Intimate Partner Violence:   . Fear of Current or Ex-Partner: Not on file  . Emotionally Abused: Not on file  . Physically Abused: Not on file  . Sexually Abused: Not on file    Past Surgical Hx:  Past Surgical History:  Procedure Laterality Date  . COLONOSCOPY  04/16/2016  . ROBOTIC ASSISTED TOTAL HYSTERECTOMY WITH BILATERAL SALPINGO OOPHERECTOMY Bilateral 08/03/2019   Procedure: XI ROBOTIC ASSISTED TOTAL HYSTERECTOMY WITH BILATERAL SALPINGO OOPHORECTOMY;  Surgeon: Denman George,  Terrence Dupont, MD;  Location: [facility];  Service: Gynecology;  Laterality: Bilateral;    Past Medical Hx:  Past Medical History:  Diagnosis Date  . Hypercholesteremia   . Hypertension    early 20's in age and during pregnancy-corrected with diet and exercise    Past Gynecological History:  See HPI Patient's last menstrual period was 08/24/2017.  Family Hx:  Family History  Problem Relation Age of Onset  . Diabetes Father   . Heart disease Father        heart attack in 40 years and passed away in 32's  . Diabetes Maternal Grandmother   . Diabetes Maternal Grandfather   . Diabetes Paternal Grandmother   . Diabetes Paternal Grandfather   . Ovarian cancer Neg Hx   . Endometrial cancer Neg Hx     Review of Systems:  Constitutional  Feels well,    ENT Normal appearing ears and nares bilaterally Skin/Breast  No rash, sores, jaundice, itching, dryness Cardiovascular  No chest pain, shortness of breath, or edema  Pulmonary  No cough or wheeze.  Gastro Intestinal  No pain  or bleeding Genito Urinary  No frequency, urgency, dysuria, no bleeding Musculo Skeletal  No myalgia, arthralgia, joint swelling or pain  Neurologic  No weakness, numbness, change in gait,  Psychology  No depression, anxiety, insomnia.   Vitals:  Blood pressure 122/77, pulse 86, temperature 98.2 F (36.8 C), temperature source Temporal, resp. rate 17, height 5\' 3"  (1.6 m), weight 146 lb 6.4 oz (66.4 kg), last menstrual period 08/24/2017, SpO2 98 %. BMI 26.  Physical Exam: WD in NAD Neck  Supple NROM, without any enlargements.  Lymph Node Survey No cervical supraclavicular or inguinal adenopathy Cardiovascular  Pulse normal rate, regularity and rhythm. S1 and S2 normal.  Lungs  Clear to auscultation bilateraly, without wheezes/crackles/rhonchi. Good air movement.  Skin  No rash/lesions/breakdown  Psychiatry  Alert and oriented to person, place, and time  Abdomen  Normoactive bowel sounds, abdomen soft, non-tender and thin without evidence of hernia. Well healed incisions.  Back No CVA tenderness Genito Urinary  Well healed vaginal cuff.  Rectal  Good tone, no masses no cul de sac nodularity but there is nodularity to the posterior surface of an ovarian cyst that can be appreciated.  Extremities  No bilateral cyanosis, clubbing or edema.   Thereasa Solo, MD  08/22/2019, 2:54 PM

## 2019-08-22 NOTE — Patient Instructions (Signed)
Please notify Dr Denman George at phone number [phone] if you have any questions.  Your tumor was called a "serous borderline tumor" of both ovaries. This is sometimes called a "low malignant potential tumor". It was stage IB because it was present in both ovaries. This condition does not require chemotherapy or radiation. There is a 2-5% risk for recurrence of the tumor (in the abdominal cavity) over the next 10 years therefore Dr Denman George will continue to see you each year in February/March for a check up and a tumor marker assessment.

## 2019-08-29 ENCOUNTER — Ambulatory Visit: Payer: BC Managed Care – PPO | Admitting: Podiatry

## 2019-08-29 ENCOUNTER — Other Ambulatory Visit: Payer: Self-pay

## 2019-08-29 ENCOUNTER — Ambulatory Visit (INDEPENDENT_AMBULATORY_CARE_PROVIDER_SITE_OTHER): Payer: BC Managed Care – PPO

## 2019-08-29 ENCOUNTER — Encounter: Payer: Self-pay | Admitting: Podiatry

## 2019-08-29 DIAGNOSIS — M2041 Other hammer toe(s) (acquired), right foot: Secondary | ICD-10-CM | POA: Diagnosis not present

## 2019-08-29 DIAGNOSIS — M205X1 Other deformities of toe(s) (acquired), right foot: Secondary | ICD-10-CM | POA: Diagnosis not present

## 2019-08-29 DIAGNOSIS — M21619 Bunion of unspecified foot: Secondary | ICD-10-CM | POA: Diagnosis not present

## 2019-09-03 NOTE — Progress Notes (Signed)
Subjective:   Patient ID: Bethany Adkins, female   DOB: 54 y.o.   MRN: [id number]   HPI 54 year old female presents the office today for concerns of a painful bunion as well as second digit on her right foot.  She states this is been ongoing for last several years but seems to be worsening.  She has tried shoe modifications, offloading eccentric improvement on the right side the second digit is now overlapping the hallux.  She also has a bunion left side but not symptomatic.  At this time she was to discuss surgical intervention on the right side.   Review of Systems  All other systems reviewed and are negative.  Past Medical History:  Diagnosis Date  . Hypercholesteremia   . Hypertension    early 20's in age and during pregnancy-corrected with diet and exercise    Past Surgical History:  Procedure Laterality Date  . COLONOSCOPY  04/16/2016  . ROBOTIC ASSISTED TOTAL HYSTERECTOMY WITH BILATERAL SALPINGO OOPHERECTOMY Bilateral 08/03/2019   Procedure: XI ROBOTIC ASSISTED TOTAL HYSTERECTOMY WITH BILATERAL SALPINGO OOPHORECTOMY;  Surgeon: Everitt Amber, MD;  Location: [facility];  Service: Gynecology;  Laterality: Bilateral;     Current Outpatient Medications:  .  ibuprofen (ADVIL) 800 MG tablet, Take 1 tablet (800 mg total) by mouth every 8 (eight) hours as needed for moderate pain. For AFTER surgery, Disp: 30 tablet, Rfl: 0 .  Omega-3 Fatty Acids (FISH OIL) 1000 MG CAPS, Take 1,000 mg by mouth at bedtime. , Disp: , Rfl:  .  senna-docusate (SENOKOT-S) 8.6-50 MG tablet, Take 2 tablets by mouth at bedtime. For AFTER surgery, do not take if having diarrhea, Disp: 30 tablet, Rfl: 1  No Known Allergies        Objective:  Physical Exam  General: AAO x3, NAD  Dermatological: Skin is warm, dry and supple bilateral. Nails x 10 are well manicured; remaining integument appears unremarkable at this time. There are no open sores, no preulcerative lesions, no rash or signs of infection  present.  Vascular: Dorsalis Pedis artery and Posterior Tibial artery pedal pulses are 2/4 bilateral with immedate capillary fill time. Pedal hair growth present. No varicosities and no lower extremity edema present bilateral. There is no pain with calf compression, swelling, warmth, erythema.   Neruologic: Grossly intact via light touch bilateral.   Musculoskeletal: Significant bunion is present in the right foot with hypermobility present of the first ray.  Overlapping second digit with hammertoe contracture present.  Tenderness on the second digit as well as the bunion as well as in between the first and second digits on the right.  On the left there is moderate to severe bunion but no overlapping digit with hammertoe contracture of the second digit.  Muscular strength 5/5 in all groups tested bilateral.  Gait: Unassisted, Nonantalgic.       Assessment:   Symptomatic bunion with overlapping second digit right foot    Plan:  -Treatment options discussed including all alternatives, risks, and complications -Etiology of symptoms were discussed -X-rays were obtained and reviewed with the patient.  Significant bunion on the right foot with overlapping second digit.  No evidence of acute fracture.  Elongated second metatarsal. -For the right foot we discussed both conservative as well as surgical treatment options.  She is attempted conservative treatment including shoe modifications, offloading padding to anticipate improvement and she is requesting surgical intervention.  We discussed Lapidus bunionectomy with possible Akin osteotomy, second tarsal osteotomy with hammertoe repair of the second digit.  We will plan to do this on December 22 and will check insurance.  We will follow back up prior to surgery.  Trula Slade DPM

## 2019-09-04 ENCOUNTER — Ambulatory Visit: Payer: BC Managed Care – PPO

## 2019-09-07 ENCOUNTER — Telehealth: Payer: Self-pay

## 2019-09-07 ENCOUNTER — Encounter: Payer: Self-pay | Admitting: Gynecologic Oncology

## 2019-09-07 NOTE — Telephone Encounter (Signed)
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 Fax # [phone]

## 2019-09-07 NOTE — Telephone Encounter (Signed)
Faxed the letter as requested by Ms Doyle Askew.

## 2019-11-07 ENCOUNTER — Ambulatory Visit
Admission: RE | Admit: 2019-11-07 | Discharge: 2019-11-07 | Disposition: A | Discharge status: Home / Self Care | Payer: BC Managed Care – PPO | Source: Ambulatory Visit | Attending: Family Medicine | Admitting: Family Medicine

## 2019-11-07 ENCOUNTER — Ambulatory Visit: Payer: BC Managed Care – PPO

## 2019-11-07 ENCOUNTER — Other Ambulatory Visit: Payer: Self-pay

## 2019-11-07 DIAGNOSIS — Z1231 Encounter for screening mammogram for malignant neoplasm of breast: Secondary | ICD-10-CM

## 2020-03-08 ENCOUNTER — Other Ambulatory Visit: Payer: BC Managed Care – PPO

## 2020-05-08 ENCOUNTER — Telehealth: Payer: Self-pay | Admitting: *Deleted

## 2020-05-08 NOTE — Telephone Encounter (Signed)
Returned the patient's call and scheduled an appt for 12/6. Patient wants to discuss some hormonal changes

## 2020-05-20 ENCOUNTER — Other Ambulatory Visit: Payer: Self-pay

## 2020-05-20 ENCOUNTER — Encounter: Payer: Self-pay | Admitting: Gynecologic Oncology

## 2020-05-20 ENCOUNTER — Inpatient Hospital Stay: Payer: BC Managed Care – PPO

## 2020-05-20 ENCOUNTER — Inpatient Hospital Stay: Payer: BC Managed Care – PPO | Attending: Gynecologic Oncology | Admitting: Gynecologic Oncology

## 2020-05-20 VITALS — BP 138/80 | HR 64 | Temp 97.8°F | Resp 18 | Wt 154.6 lb

## 2020-05-20 DIAGNOSIS — R232 Flushing: Secondary | ICD-10-CM | POA: Diagnosis not present

## 2020-05-20 DIAGNOSIS — K449 Diaphragmatic hernia without obstruction or gangrene: Secondary | ICD-10-CM | POA: Insufficient documentation

## 2020-05-20 DIAGNOSIS — Z90722 Acquired absence of ovaries, bilateral: Secondary | ICD-10-CM | POA: Insufficient documentation

## 2020-05-20 DIAGNOSIS — Z87891 Personal history of nicotine dependence: Secondary | ICD-10-CM | POA: Insufficient documentation

## 2020-05-20 DIAGNOSIS — D3912 Neoplasm of uncertain behavior of left ovary: Secondary | ICD-10-CM | POA: Diagnosis present

## 2020-05-20 DIAGNOSIS — E7800 Pure hypercholesterolemia, unspecified: Secondary | ICD-10-CM | POA: Diagnosis not present

## 2020-05-20 DIAGNOSIS — Z90710 Acquired absence of both cervix and uterus: Secondary | ICD-10-CM | POA: Insufficient documentation

## 2020-05-20 DIAGNOSIS — E039 Hypothyroidism, unspecified: Secondary | ICD-10-CM | POA: Insufficient documentation

## 2020-05-20 DIAGNOSIS — I10 Essential (primary) hypertension: Secondary | ICD-10-CM | POA: Insufficient documentation

## 2020-05-20 DIAGNOSIS — N951 Menopausal and female climacteric states: Secondary | ICD-10-CM | POA: Diagnosis present

## 2020-05-20 MED ORDER — ESTRADIOL 0.025 MG/24HR TD PTTW: 1.0000 | MEDICATED_PATCH | TRANSDERMAL | 12 refills | Status: AC

## 2020-05-20 NOTE — Patient Instructions (Signed)
Dr Denman George is recommending first having your thyroid hormone checked by your primary care doctor. They will be able to compare to previous levels and adjust your thyroid replacement as they see fit. If your thyroid is functioning normally, then it is reasonable to try the estrogen replacement treatment that Dr Denman George prescribed (a patch that you change twice a week).  For your hiatal hernia you should consider consultation with one of the doctors at [facility]: Dr Greer Pickerel, Dr Gurney Maxin. Their phone number is [phone].  For annual check-ups for your history of borderline tumor you can either call our office [phone]) once a year in the fall to schedule an appointment with Joylene John, our nurse practitioner for a pelvic exam and CA 125 tumor marker check, or have Dr Roselie Awkward perform these evaluations (his office number is [phone]). Dr Roselie Awkward is a general gynecologist who will be able to manage general gynecologic needs in addition to your history of an ovarian borderline tumor.

## 2020-05-20 NOTE — Progress Notes (Signed)
Follow-up Note: Gyn-Onc  Consult was requested by Dr. Roselie Awkward for the evaluation of Bethany Adkins 54 y.o. female  CC:  Chief Complaint  Patient presents with  . Fatigue, weight gain, exercise intolerance;  History of LMP of bilateral ovaries.     Assessment/Plan:  Bethany Adkins  is a 54 y.o.  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  Recommendation is for close follow-up with annual exams and CA 125 assessments every 12 months x 10 years.  Menopausal symptoms: it is unclear how much of her symptoms are secondary to menopause vs hypothyroid. I encouraged her to first see her PCP for evaluation of her thyroid. If this is abnormal, she requires thyroid medication titration at their discretion. If this is normal/stable, I have prescribed vivelle dot to try to see if this improves her symptoms which may be secondary to menopause.  Symptoms of dizziness/hearburn: recommended PCP follow-up. I explained that as an oncology surgeon I do not work-up or manage these symptoms.   She requires annual long term (10 year) surveillance for her history of LMP. I discussed that she can return to see Dr Roselie Awkward annually for these evaluations. Alternatively, she can follow-up in our office with our nurse practitioner, Joylene John, NP.   She has hiatal hernia that is symptomatic - provided with [facility] number for surgical evaluation.   HPI: Bethany Adkins is a 54 year old P3 who was seen in consultation at the request of Dr Roselie Awkward for bilateral complex ovarian masses.   The patient reported first noting symptoms of abdominal pains on June 21, 2019.  She attributed this to stress from the events [place] that had occurred that day.  However the pain and abdominal bloating continued and she eventually determined that this was less likely to be a psychologic  phenomenon and instead saw her gynecologist Dr. Roselie Awkward on July 20, 2019.  At that time a transvaginal ultrasound scan was performed which revealed bilateral ovarian masses measuring on the right 13.8 x 6.6 x 14 cm containing solid components, complex cystic components with internal echogenicity and a few simple cystic components, and a complex multiloculated solid and cystic left ovarian mass measuring 8.1 x 5.4 x 5.8 cm containing solid components complex cystic components with internal echogenicity irregular septations and mild mural nodularity.  There was Doppler blood flow within the complicated components on color Doppler imaging.  The uterus itself was unremarkable and measured 8.8 x 2.5 x 3.3 cm with a 2 mm endometrium.  Ca1 25 was drawn on 07/20/2019 and was normal at 25.3.  The patient was postmenopausal as of February 2020.  On 08/03/19 she underwent robotic assisted total hysterectomy with BSO. Intraoperative findings were significant for the right ovary was grossly enlarged approximately 15 cm with multiple cysts and solid areas with torsion.  The left ovary was enlarged approximately 10 cm with multiple cysts and solid areas.  The abdomen was otherwise normal with no ascites or carcinomatosis.  There was a normal appearing uterus.. Surgery was uncomplicated and frozen section revealed low malignant potential tumor.  Final pathology revealed a micropapillary serous borderline tumor with microinvasion confined to the ovary.  There was no surface invasion or involvement or capsule rupture of either ovary.  The left ovary contained micro micropapillary serous borderline tumor with no invasion identified.  Similarly there was no capsule rupture  or surface involvement.  The uterus was benign.. Her case was reviewed at multidisciplinary conference including pathology review and plan was for close follow-up with annual surveillance with tumor markers.  Interval Hx:   She was recommended to receive  annual follow-up with Dr Roselie Awkward for pelvic examinations and CA 125 checks.  She presented today for follow-up to discuss hormonal changes.  She reported a 3 month history of progressive fatigue, exercise intolerance and weight gain. She has a history of hypothyroidism. She feels these have gotten worse since her surgery. She has hot flashes.  She reported occasional dizziness and heartburn/chest pain that feels similar to when she was pregnant and had heart burn. She has a known hiatal hernia.    Current Meds:  Outpatient Encounter Medications as of 05/20/2020  Medication Sig  . Maca Root (FEMMENESSENCE MACAPAUSE PO) Take 1 tablet by mouth daily.  . Omega-3 Fatty Acids (FISH OIL) 1000 MG CAPS Take 1,000 mg by mouth at bedtime.   Marland Kitchen UNABLE TO FIND Lean Bean  . estradiol (VIVELLE-DOT) 0.025 MG/24HR Place 1 patch onto the skin 2 (two) times a week.  . [DISCONTINUED] ibuprofen (ADVIL) 800 MG tablet Take 1 tablet (800 mg total) by mouth every 8 (eight) hours as needed for moderate pain. For AFTER surgery  . [DISCONTINUED] senna-docusate (SENOKOT-S) 8.6-50 MG tablet Take 2 tablets by mouth at bedtime. For AFTER surgery, do not take if having diarrhea   No facility-administered encounter medications on file as of 05/20/2020.    Allergy: No Known Allergies  Social Hx:   Social History   Socioeconomic History  . Marital status: Single    Spouse name: Not on file  . Number of children: Not on file  . Years of education: Not on file  . Highest education level: Not on file  Occupational History  . Not on file  Tobacco Use  . Smoking status: Former Smoker    Years: 10.00    Types: Cigarettes    Quit date: 07/24/2008    Years since quitting: 11.8  . Smokeless tobacco: Never Used  Vaping Use  . Vaping Use: Never used  Substance and Sexual Activity  . Alcohol use: No    Alcohol/week: 0.0 standard drinks  . Drug use: No  . Sexual activity: Yes    Partners: Male    Birth control/protection:  Surgical    Comment: vasectomy  Other Topics Concern  . Not on file  Social History Narrative  . Not on file   Social Determinants of Health   Financial Resource Strain:   . Difficulty of Paying Living Expenses: Not on file  Food Insecurity:   . Worried About Charity fundraiser in the Last Year: Not on file  . Ran Out of Food in the Last Year: Not on file  Transportation Needs:   . Lack of Transportation (Medical): Not on file  . Lack of Transportation (Non-Medical): Not on file  Physical Activity:   . Days of Exercise per Week: Not on file  . Minutes of Exercise per Session: Not on file  Stress:   . Feeling of Stress : Not on file  Social Connections:   . Frequency of Communication with Friends and Family: Not on file  . Frequency of Social Gatherings with Friends and Family: Not on file  . Attends Religious Services: Not on file  . Active Member of Clubs or Organizations: Not on file  . Attends Archivist Meetings: Not on file  .  Marital Status: Not on file  Intimate Partner Violence:   . Fear of Current or Ex-Partner: Not on file  . Emotionally Abused: Not on file  . Physically Abused: Not on file  . Sexually Abused: Not on file    Past Surgical Hx:  Past Surgical History:  Procedure Laterality Date  . COLONOSCOPY  04/16/2016  . ROBOTIC ASSISTED TOTAL HYSTERECTOMY WITH BILATERAL SALPINGO OOPHERECTOMY Bilateral 08/03/2019   Procedure: XI ROBOTIC ASSISTED TOTAL HYSTERECTOMY WITH BILATERAL SALPINGO OOPHORECTOMY;  Surgeon: Everitt Amber, MD;  Location: [facility];  Service: Gynecology;  Laterality: Bilateral;    Past Medical Hx:  Past Medical History:  Diagnosis Date  . Hypercholesteremia   . Hypertension    early 20's in age and during pregnancy-corrected with diet and exercise    Past Gynecological History:  See HPI Patient's last menstrual period was 08/24/2017.  Family Hx:  Family History  Problem Relation Age of Onset  . Diabetes Father   . Heart  disease Father        heart attack in 40 years and passed away in 82's  . Diabetes Maternal Grandmother   . Diabetes Maternal Grandfather   . Diabetes Paternal Grandmother   . Diabetes Paternal Grandfather   . Ovarian cancer Neg Hx   . Endometrial cancer Neg Hx     Review of Systems:  Constitutional  Feels fatigued, +weight gain.  ENT Normal appearing ears and nares bilaterally Skin/Breast  No rash, sores, jaundice, itching, dryness Cardiovascular  + chest burning sensation similar to heartburn Pulmonary  No cough or wheeze.  Gastro Intestinal  No pain or bleeding Genito Urinary  No frequency, urgency, dysuria, no bleeding Musculo Skeletal  No myalgia, arthralgia, joint swelling or pain  Neurologic  No weakness, numbness, change in gait,  Psychology  No depression, anxiety, insomnia.   Vitals:  Blood pressure 138/80, pulse 64, temperature 97.8 F (36.6 C), temperature source Tympanic, resp. rate 18, weight 154 lb 9.6 oz (70.1 kg), last menstrual period 08/24/2017, SpO2 99 %. BMI 26.  Physical Exam: WD in NAD Neck  Supple NROM, without any enlargements.  Lymph Node Survey No cervical supraclavicular or inguinal adenopathy Cardiovascular  Well perfused peripheries Lungs  No increased WOB Skin  No rash/lesions/breakdown  Psychiatry  Alert and oriented to person, place, and time  Abdomen  Normoactive bowel sounds, abdomen soft, non-tender and thin without evidence of hernia. Soft incisions.  Back No CVA tenderness Genito Urinary  Smooth vaginal cuff, no palpable pelvic masses Rectal  deferred Extremities  No bilateral cyanosis, clubbing or edema.   Thereasa Solo, MD  05/20/2020, 2:40 PM

## 2020-05-21 ENCOUNTER — Telehealth: Payer: Self-pay

## 2020-05-21 LAB — CA 125: Cancer Antigen (CA) 125: 8.5 U/mL (ref 0.0–38.1)

## 2020-05-21 NOTE — Telephone Encounter (Signed)
Telephone call to patient to give WNL Ca 125 result of 8.5. Patient verbalized understanding of information given and is appreciative of call.

## 2021-03-20 ENCOUNTER — Other Ambulatory Visit: Payer: Self-pay | Admitting: Family Medicine

## 2021-03-20 DIAGNOSIS — Z1231 Encounter for screening mammogram for malignant neoplasm of breast: Secondary | ICD-10-CM

## 2021-04-17 ENCOUNTER — Other Ambulatory Visit: Payer: Self-pay

## 2021-04-17 ENCOUNTER — Ambulatory Visit
Admission: RE | Admit: 2021-04-17 | Discharge: 2021-04-17 | Disposition: A | Discharge status: Home / Self Care | Payer: BC Managed Care – PPO | Source: Ambulatory Visit | Attending: Family Medicine | Admitting: Family Medicine

## 2021-04-17 DIAGNOSIS — Z1231 Encounter for screening mammogram for malignant neoplasm of breast: Secondary | ICD-10-CM

## 2021-05-14 IMAGING — US US PELVIS COMPLETE WITH TRANSVAGINAL
1 series · 13 of 25 positions shown · non-contrast
Comparison: None

CLINICAL DATA: Pelvic mass

EXAM:
TRANSABDOMINAL AND TRANSVAGINAL ULTRASOUND OF PELVIS
TECHNIQUE: Both transabdominal and transvaginal ultrasound examinations of the
pelvis were performed. Transabdominal technique was performed for
global imaging of the pelvis including uterus, ovaries, adnexal
regions, and pelvic cul-de-sac. It was necessary to proceed with
endovaginal exam following the transabdominal exam to visualize the
endometrium, and to characterize ovarian abnormalities.

[Series 1: us pelvis complete with transvaginal · 0.23mm/px · 73 acquisitions, 13 frames shown]
[im 1/73]
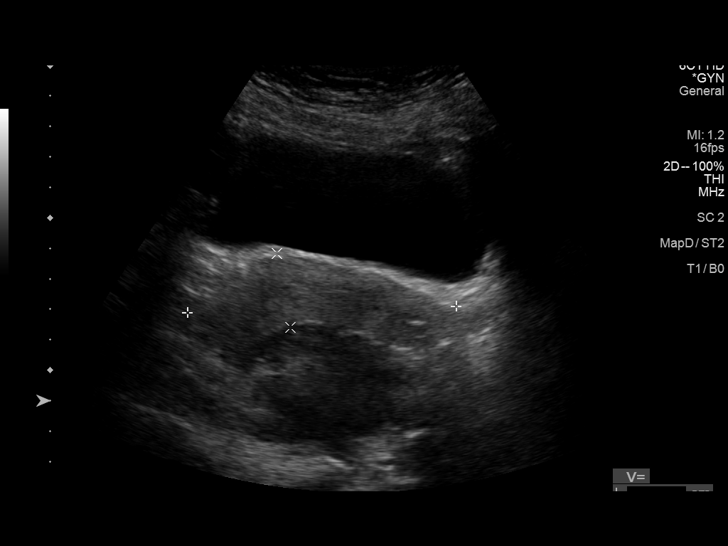
[im 7/73]
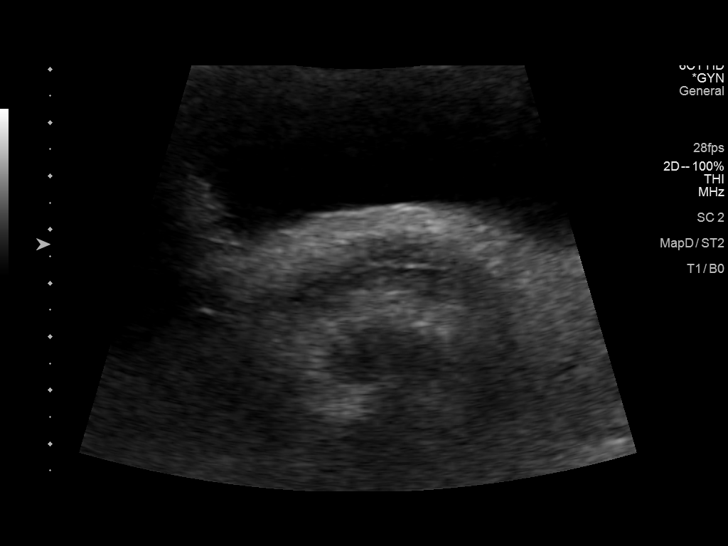
[im 13/73]
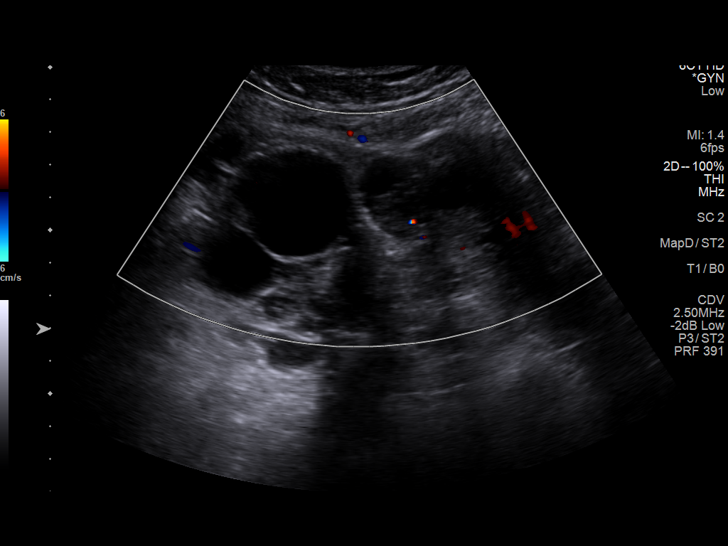
[im 19/73]
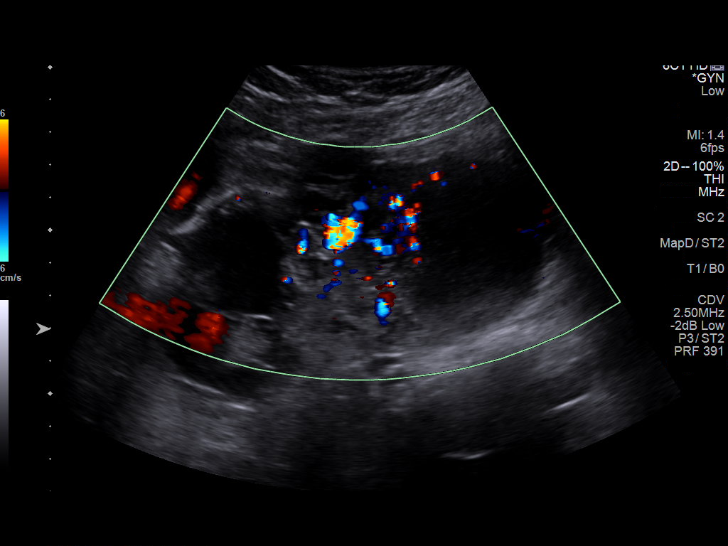
[im 25/73]
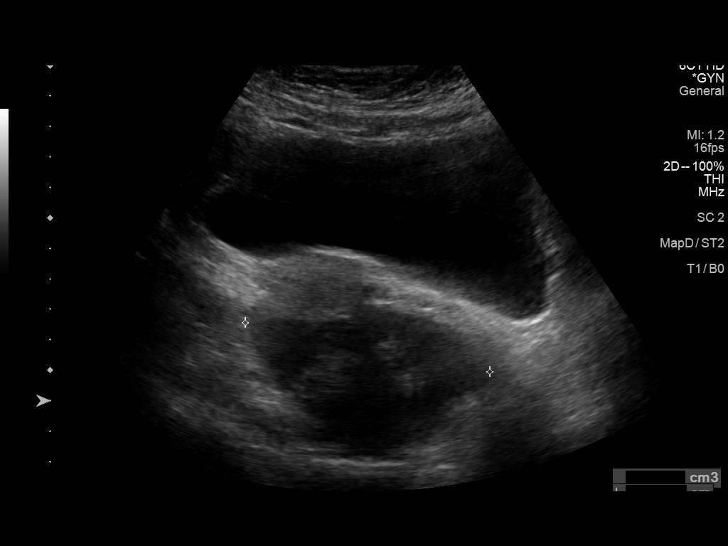
[im 31/73]
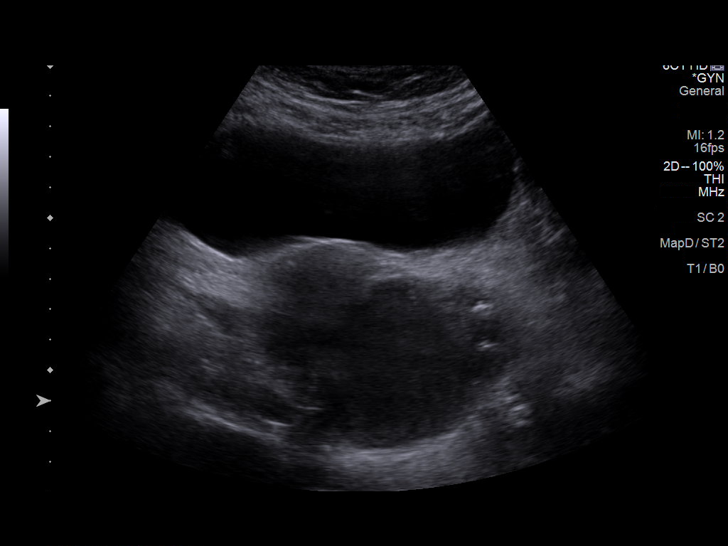
[im 37/73]
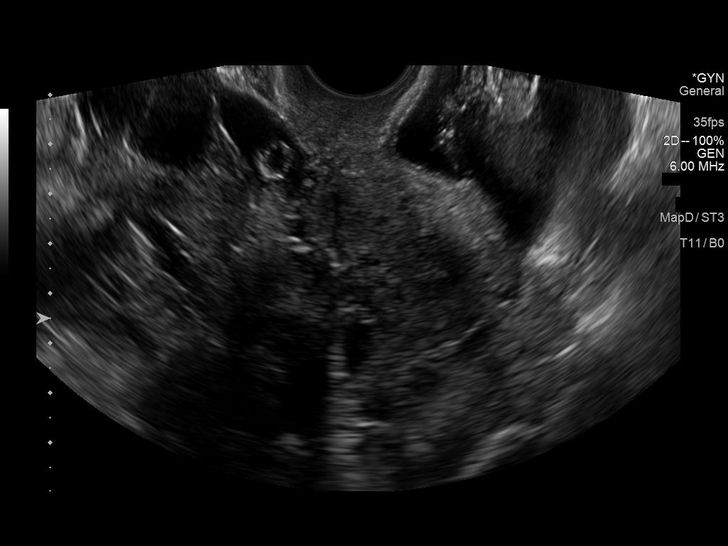
[im 43/73]
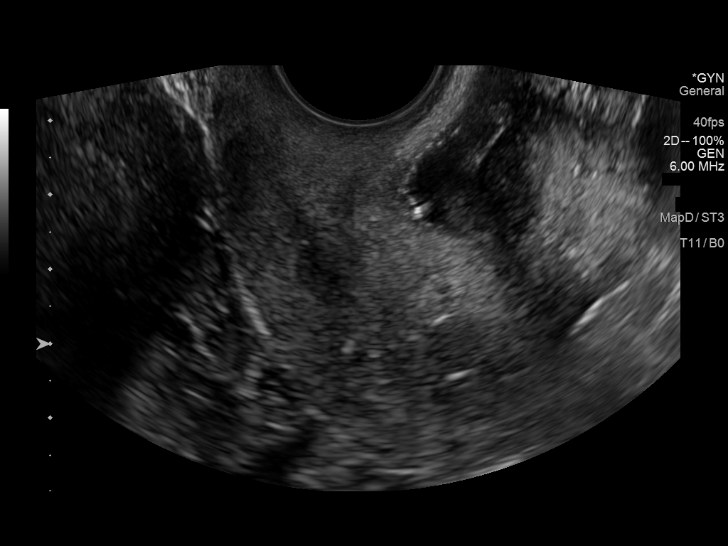
[im 49/73]
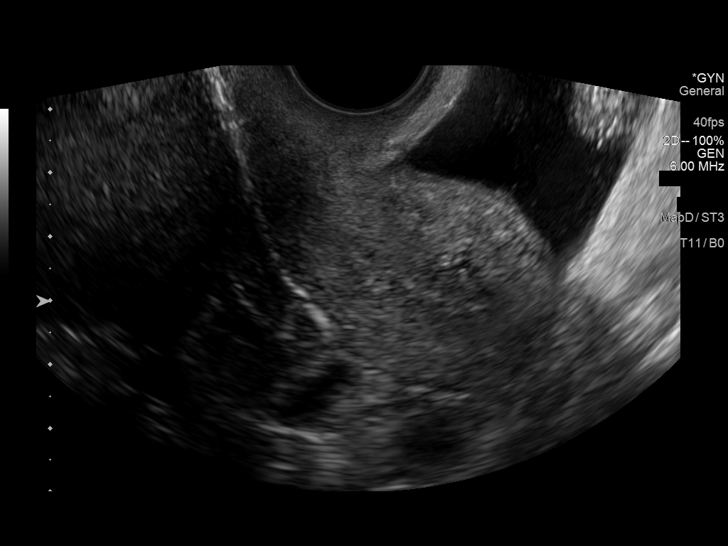
[im 55/73]
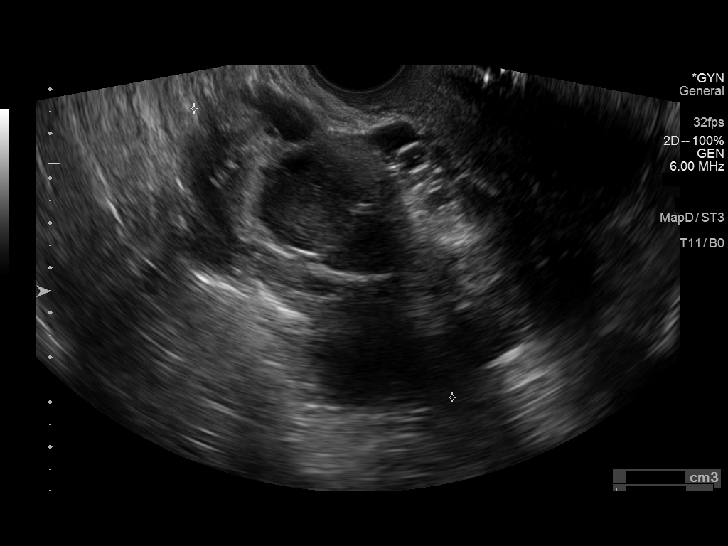
[im 61/73]
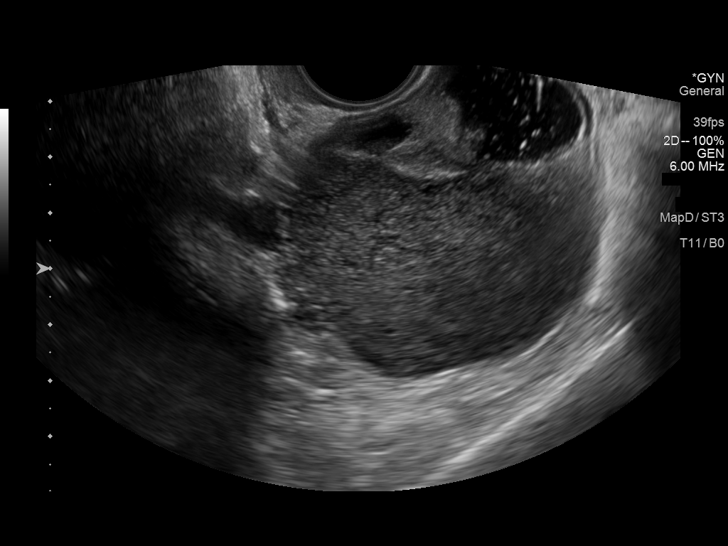
[im 67/73]
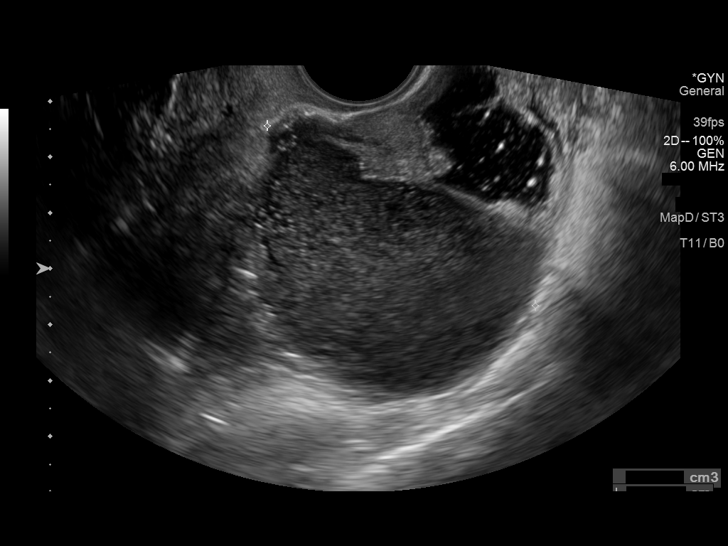
[im 73/73]
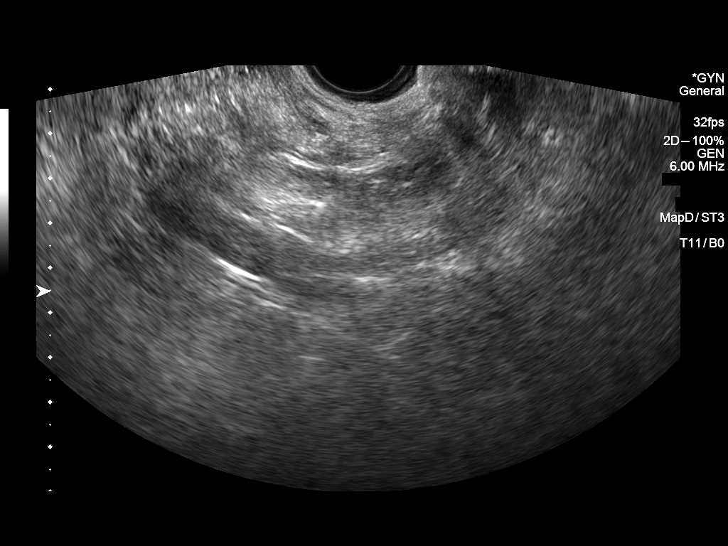

[13 of 25 positions shown; findings below may reference images not displayed]

FINDINGS: Uterus

Measurements: 8.8 x 2.5 x 3.3 cm = volume: 38 mL. Retroverted.
Mildly heterogeneous myometrial echogenicity without focal mass

Endometrium

Thickness: 2 mm.  No endometrial fluid or focal abnormality

Right ovary

No normal appearing RIGHT ovarian tissue visualized, see below

Left ovary

No normal appearing LEFT ovarian tissue visualized, see below

Other findings

BILATERAL complex adnexal masses consistent with BILATERAL ovarian
neoplasms.

Complex multiloculated solid and cystic mass of RIGHT ovary 13.8 x
6.6 x 14.0 cm, containing solid components, complex cystic
components with internal echogenicity, and a few simple cystic
components.

Complex multiloculated solid and cystic LEFT ovarian mass 8.1 x
x 5.8 cm, containing solid components, complex cystic components
with internal echogenicity, irregular septations, and mild mural
nodularity.

Both large adnexal masses demonstrate blood flow within the
complicated components on color Doppler imaging.
IMPRESSION: Unremarkable uterus and endometrial complex.

Large complex solid and cystic masses in the adnexa bilaterally
consistent with BILATERAL ovarian neoplasms, measuring 13.8 x 6.6 x
14.0 cm RIGHT and 8.1 x 5.4 x 5.8 cm LEFT.

Surgical evaluation recommended.

These results will be called to the ordering clinician or
representative by the Radiologist Assistant, and communication
documented in the PACS or zVision Dashboard.

## 2022-06-25 ENCOUNTER — Other Ambulatory Visit: Payer: Self-pay | Admitting: Physician Assistant

## 2022-06-25 ENCOUNTER — Other Ambulatory Visit: Payer: Self-pay

## 2022-06-25 DIAGNOSIS — R1902 Left upper quadrant abdominal swelling, mass and lump: Secondary | ICD-10-CM

## 2022-07-02 ENCOUNTER — Other Ambulatory Visit: Payer: Self-pay | Admitting: Family Medicine

## 2022-07-02 DIAGNOSIS — Z1231 Encounter for screening mammogram for malignant neoplasm of breast: Secondary | ICD-10-CM

## 2022-07-13 ENCOUNTER — Ambulatory Visit
Admission: RE | Admit: 2022-07-13 | Discharge: 2022-07-13 | Disposition: A | Discharge status: Home / Self Care | Payer: BC Managed Care – PPO | Source: Ambulatory Visit | Attending: Physician Assistant | Admitting: Physician Assistant

## 2022-07-13 DIAGNOSIS — R1902 Left upper quadrant abdominal swelling, mass and lump: Secondary | ICD-10-CM

## 2022-08-20 ENCOUNTER — Ambulatory Visit
Admission: RE | Admit: 2022-08-20 | Discharge: 2022-08-20 | Disposition: A | Discharge status: Home / Self Care | Payer: BC Managed Care – PPO | Source: Ambulatory Visit | Attending: Family Medicine | Admitting: Family Medicine

## 2022-08-20 DIAGNOSIS — Z1231 Encounter for screening mammogram for malignant neoplasm of breast: Secondary | ICD-10-CM

## 2023-02-12 IMAGING — MG MM DIGITAL SCREENING BILAT W/ TOMO AND CAD
8 series · 8 of 24 positions shown · non-contrast
Comparison: Previous exam(s).

CLINICAL DATA: Screening.

EXAM:
DIGITAL SCREENING BILATERAL MAMMOGRAM WITH TOMOSYNTHESIS AND CAD
TECHNIQUE: Bilateral screening digital craniocaudal and mediolateral oblique
mammograms were obtained. Bilateral screening digital breast
tomosynthesis was performed. The images were evaluated with
computer-aided detection.

[R CC synth-2D]
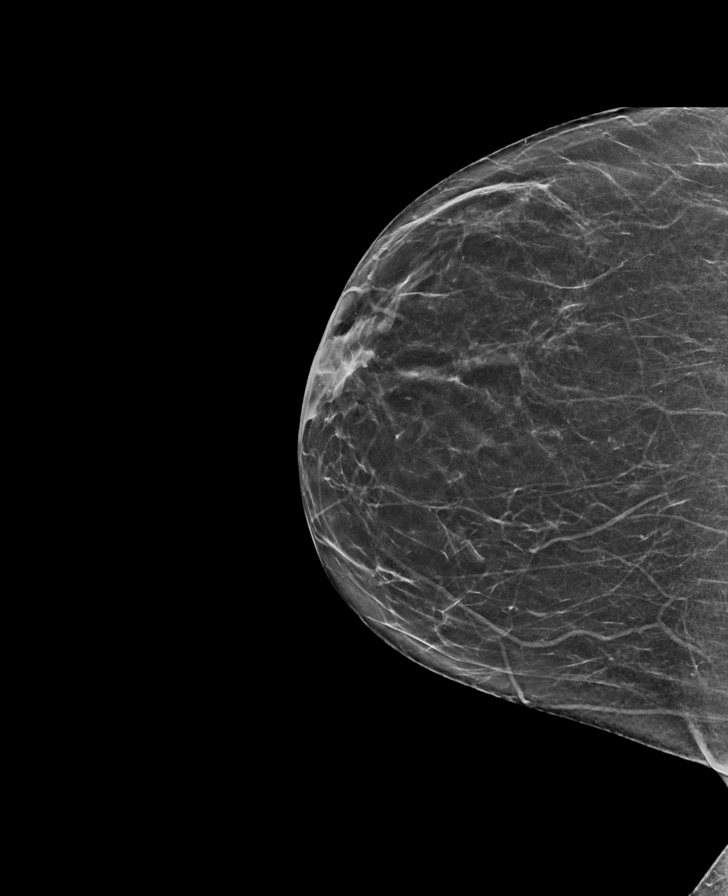

[L MLO synth-2D]
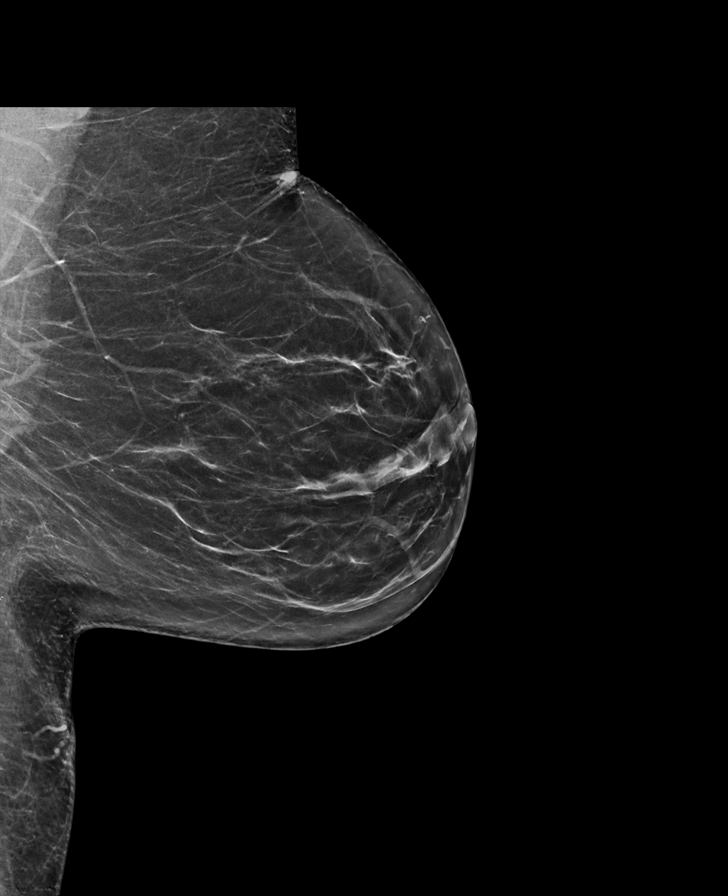

[R MLO synth-2D]
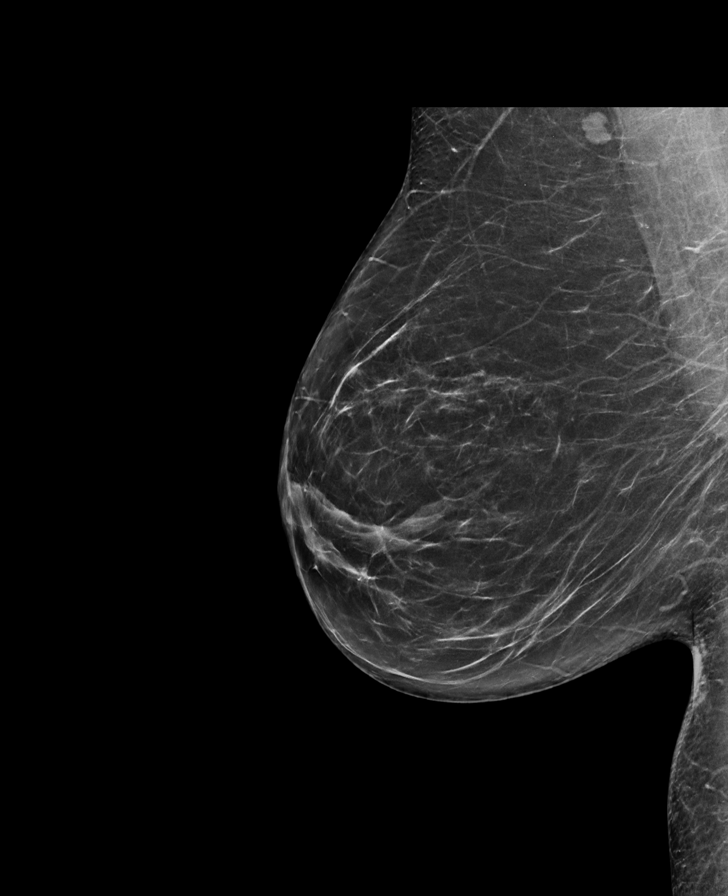

[L CC synth-2D]
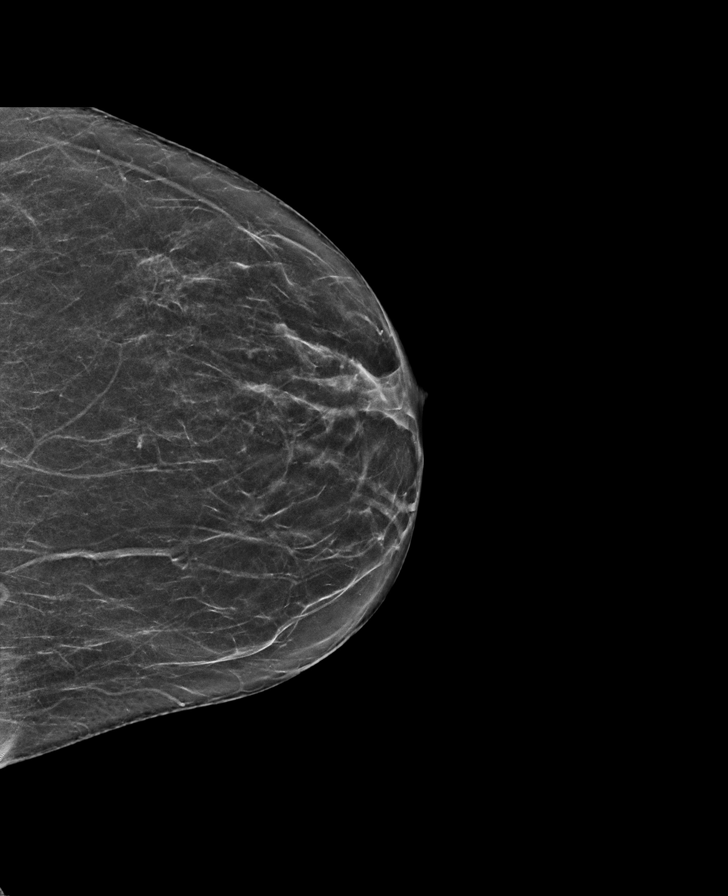

[R MLO tomo · tomo slice 37/72.0]
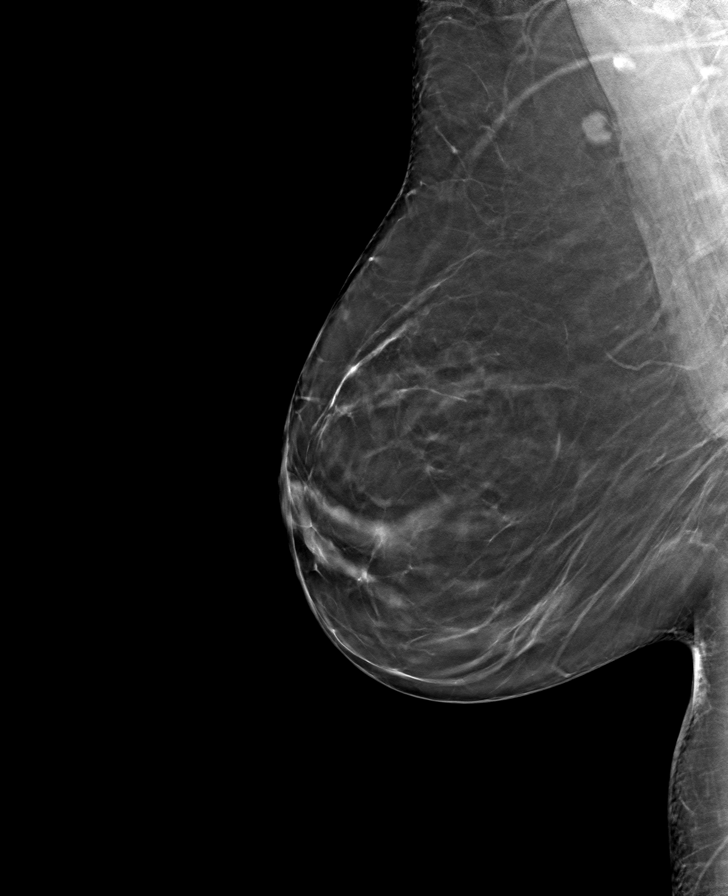

[L MLO tomo · tomo slice 35/70.0]
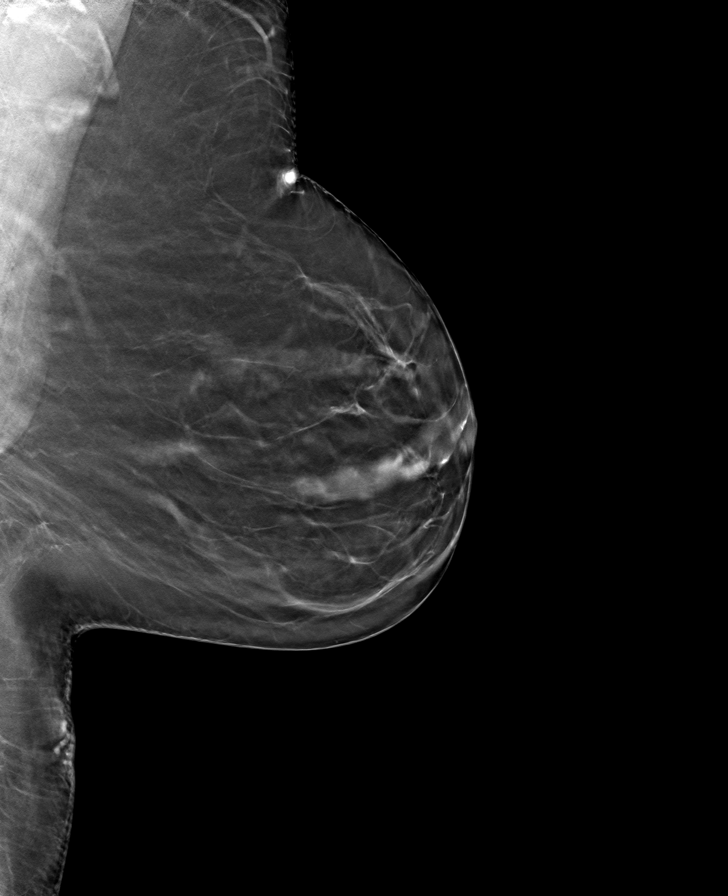

[R CC tomo · tomo slice 30/59.0]
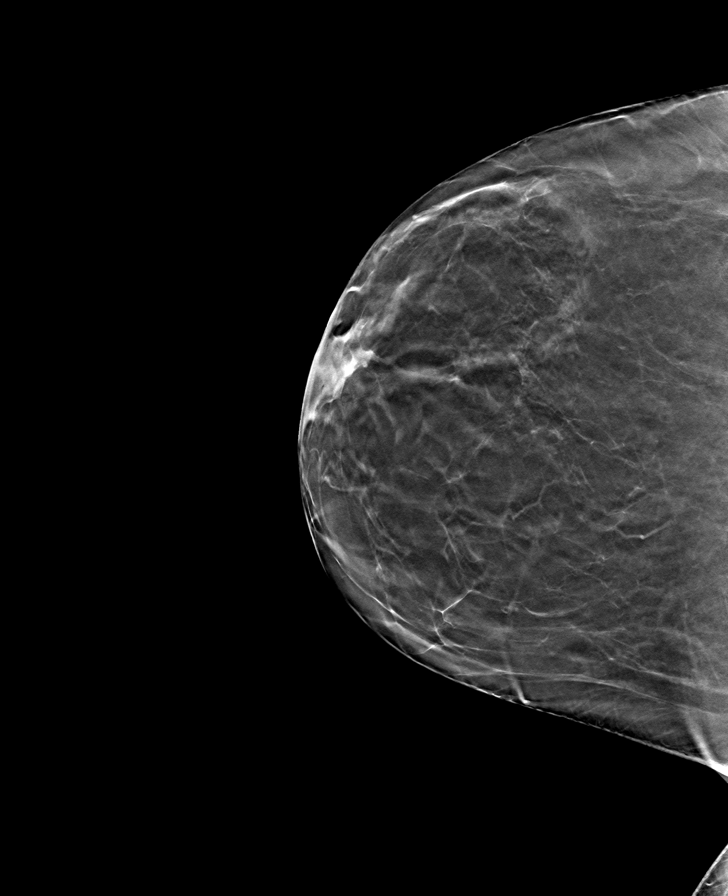

[L CC tomo · tomo slice 29/57.0]
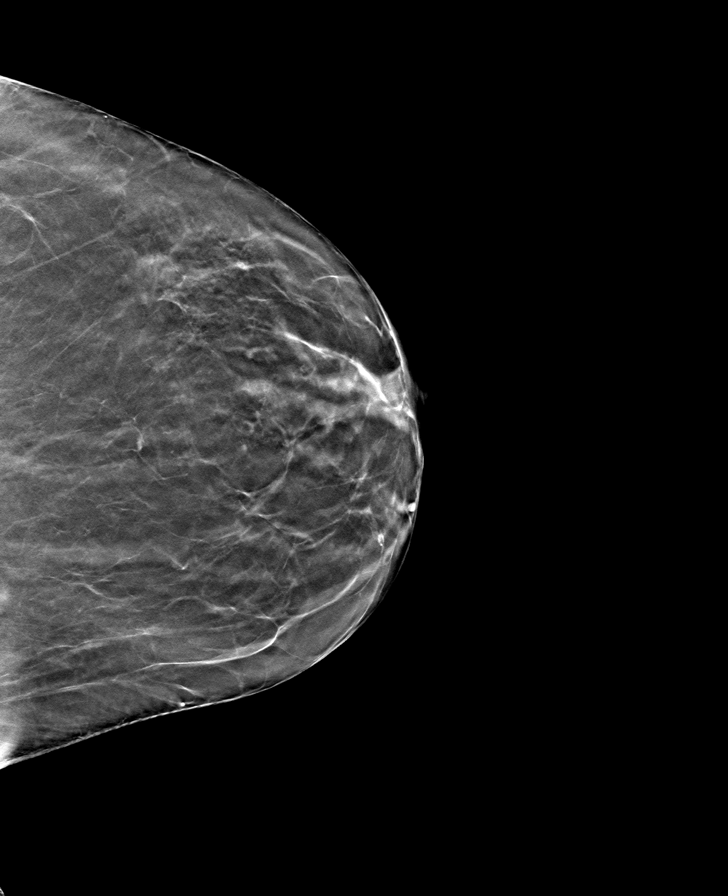

[8 of 24 positions shown; findings below may reference images not displayed]

ACR Breast Density Category b: There are scattered areas of
fibroglandular density.
FINDINGS: There are no findings suspicious for malignancy.
IMPRESSION: No mammographic evidence of malignancy. A result letter of this
screening mammogram will be mailed directly to the patient.

RECOMMENDATION:
Screening mammogram in one year. (Code:51-O-LD2)

BI-RADS CATEGORY  1: Negative.

## 2023-07-02 ENCOUNTER — Other Ambulatory Visit: Payer: Self-pay | Admitting: Gastroenterology

## 2023-07-02 ENCOUNTER — Ambulatory Visit: Payer: Self-pay

## 2023-07-02 ENCOUNTER — Encounter: Payer: Self-pay | Admitting: Gastroenterology

## 2023-07-02 DIAGNOSIS — R109 Unspecified abdominal pain: Secondary | ICD-10-CM

## 2023-07-06 ENCOUNTER — Ambulatory Visit
Admission: RE | Admit: 2023-07-06 | Discharge: 2023-07-06 | Disposition: A | Discharge status: Home / Self Care | Payer: 59 | Source: Ambulatory Visit | Attending: Gastroenterology | Admitting: Gastroenterology

## 2023-07-06 DIAGNOSIS — R109 Unspecified abdominal pain: Secondary | ICD-10-CM

## 2023-11-15 ENCOUNTER — Other Ambulatory Visit: Payer: Self-pay | Admitting: Family Medicine

## 2023-11-15 DIAGNOSIS — Z1231 Encounter for screening mammogram for malignant neoplasm of breast: Secondary | ICD-10-CM

## 2023-11-17 ENCOUNTER — Ambulatory Visit
Admission: RE | Admit: 2023-11-17 | Discharge: 2023-11-17 | Disposition: A | Discharge status: Home / Self Care | Source: Ambulatory Visit | Attending: Family Medicine | Admitting: Family Medicine

## 2023-11-17 DIAGNOSIS — Z1231 Encounter for screening mammogram for malignant neoplasm of breast: Secondary | ICD-10-CM
